# Patient Record
Sex: Female | Born: 1995 | Race: Black or African American | Hispanic: No | Marital: Single | Smoking: Former smoker
Health system: Southern US, Community
[De-identification: ages and names within clinical notes are randomized; demographics above are authoritative.]

## PROBLEM LIST (undated history)

## (undated) DIAGNOSIS — R102 Pelvic and perineal pain unspecified side: Secondary | ICD-10-CM

## (undated) DIAGNOSIS — R7303 Prediabetes: Secondary | ICD-10-CM

## (undated) DIAGNOSIS — G43909 Migraine, unspecified, not intractable, without status migrainosus: Secondary | ICD-10-CM

## (undated) DIAGNOSIS — L709 Acne, unspecified: Secondary | ICD-10-CM

## (undated) DIAGNOSIS — K219 Gastro-esophageal reflux disease without esophagitis: Secondary | ICD-10-CM

## (undated) DIAGNOSIS — N926 Irregular menstruation, unspecified: Secondary | ICD-10-CM

## (undated) DIAGNOSIS — J309 Allergic rhinitis, unspecified: Secondary | ICD-10-CM

## (undated) HISTORY — DX: Allergic rhinitis, unspecified: J30.9

## (undated) HISTORY — DX: Irregular menstruation, unspecified: N92.6

## (undated) HISTORY — DX: Migraine, unspecified, not intractable, without status migrainosus: G43.909

## (undated) HISTORY — DX: Morbid (severe) obesity due to excess calories: E66.01

## (undated) HISTORY — DX: Gastro-esophageal reflux disease without esophagitis: K21.9

## (undated) HISTORY — DX: Pelvic and perineal pain: R10.2

## (undated) HISTORY — DX: Pelvic and perineal pain unspecified side: R10.20

## (undated) HISTORY — DX: Acne, unspecified: L70.9

---
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
## 2017-03-03 ENCOUNTER — Encounter: Payer: Self-pay | Admitting: Emergency Medicine

## 2017-03-03 ENCOUNTER — Emergency Department: Payer: Medicaid Other

## 2017-03-03 ENCOUNTER — Emergency Department
Admission: EM | Admit: 2017-03-03 | Discharge: 2017-03-03 | Disposition: A | Discharge status: Home / Self Care | Payer: Medicaid Other | Attending: Student in an Organized Health Care Education/Training Program | Admitting: Student in an Organized Health Care Education/Training Program

## 2017-03-03 DIAGNOSIS — R1011 Right upper quadrant pain: Secondary | ICD-10-CM | POA: Insufficient documentation

## 2017-03-03 DIAGNOSIS — R252 Cramp and spasm: Secondary | ICD-10-CM | POA: Diagnosis present

## 2017-03-03 DIAGNOSIS — K649 Unspecified hemorrhoids: Secondary | ICD-10-CM | POA: Insufficient documentation

## 2017-03-03 DIAGNOSIS — K5900 Constipation, unspecified: Secondary | ICD-10-CM | POA: Insufficient documentation

## 2017-03-03 LAB — URINALYSIS, COMPLETE (UACMP) WITH MICROSCOPIC
BILIRUBIN URINE: NEGATIVE
GLUCOSE, UA: NEGATIVE mg/dL
Ketones, ur: 5 mg/dL — AB
LEUKOCYTES UA: NEGATIVE
NITRITE: NEGATIVE
PH: 5 (ref 5.0–8.0)
Protein, ur: 30 mg/dL — AB
SPECIFIC GRAVITY, URINE: 1.032 — AB (ref 1.005–1.030)

## 2017-03-03 LAB — CBC
HEMATOCRIT: 35.8 % (ref 35.0–47.0)
Hemoglobin: 11.5 g/dL — ABNORMAL LOW (ref 12.0–16.0)
MCH: 24.5 pg — ABNORMAL LOW (ref 26.0–34.0)
MCHC: 32 g/dL (ref 32.0–36.0)
MCV: 76.6 fL — ABNORMAL LOW (ref 80.0–100.0)
PLATELETS: 345 10*3/uL (ref 150–440)
RBC: 4.68 MIL/uL (ref 3.80–5.20)
RDW: 16.2 % — AB (ref 11.5–14.5)
WBC: 12.3 10*3/uL — AB (ref 3.6–11.0)

## 2017-03-03 LAB — COMPREHENSIVE METABOLIC PANEL
ALK PHOS: 61 U/L (ref 38–126)
ALT: 16 U/L (ref 14–54)
AST: 19 U/L (ref 15–41)
Albumin: 4 g/dL (ref 3.5–5.0)
Anion gap: 7 (ref 5–15)
BILIRUBIN TOTAL: 0.5 mg/dL (ref 0.3–1.2)
BUN: 10 mg/dL (ref 6–20)
CALCIUM: 9 mg/dL (ref 8.9–10.3)
CO2: 25 mmol/L (ref 22–32)
CREATININE: 0.76 mg/dL (ref 0.44–1.00)
Chloride: 108 mmol/L (ref 101–111)
GFR calc Af Amer: >60 mL/min (ref >60)
Glucose, Bld: 111 mg/dL — ABNORMAL HIGH (ref 65–99)
POTASSIUM: 3.6 mmol/L (ref 3.5–5.1)
Sodium: 140 mmol/L (ref 135–145)
TOTAL PROTEIN: 7.7 g/dL (ref 6.5–8.1)

## 2017-03-03 LAB — LIPASE, BLOOD: LIPASE: 26 U/L (ref 11–51)

## 2017-03-03 LAB — POCT PREGNANCY, URINE: Preg Test, Ur: NEGATIVE

## 2017-03-03 MED ORDER — HYDROCORTISONE 2.5 % RE CREA: TOPICAL_CREAM | RECTAL | 1 refills | Status: AC

## 2017-03-03 MED ORDER — POLYETHYLENE GLYCOL 3350 17 G PO PACK: 17.0000 g | PACK | Freq: Every day | ORAL | 0 refills | Status: DC

## 2017-03-03 NOTE — ED Triage Notes (Addendum)
Pt presents to ED via POV with c/o intermittent abdominal cramping, vaginal and rectal bleeding, and headache x2 weeks. Pt reports s/x's have come and gone over the last 14 days. Pt denies SHOB, CP, fevers or chills. Pt denies possibility of being pregnant, but last "normal" period was in February. Pt is A&O, in NAD; RR even regular, and unlabored.

## 2017-03-03 NOTE — ED Provider Notes (Signed)
[facility] Emergency Department Provider Note    First MD Initiated Contact with Patient 03/03/17 2117     (approximate)  I have reviewed the triage vital signs and the nursing notes.   HISTORY  Chief Complaint Abdominal Pain; Rectal Bleeding; and Vaginal Bleeding    HPI Natasha Lam is a 21 y.o. female presents with multiple complaints. Primarily seemed to be roughly 2 weeks of intermittent abdominal cramping and rectal bleeding. Patient is having some pain with defecation. States she is also having intermittent headaches. Has had headaches before. No fevers. States that the pain is primarily located in the right upper quadrant at this time. States she is also having some vaginal bleeding. She is not sexually active. Denies any chance of being pregnant. Does have a history of irregular menses. No lower abdominal pain or pelvic pain. No chest pain or shortness of breath.   No past medical history on file. No family history on file. No past surgical history on file. There are no active problems to display for this patient.     Prior to Admission medications   Medication Sig Start Date End Date Taking? Authorizing Provider  hydrocortisone (ANUSOL-HC) 2.5 % rectal cream Apply rectally 2 times daily 03/03/17 03/03/18  Willy Eddy, MD  polyethylene glycol [facility] / Ethelene Hal) packet Take 17 g by mouth daily. Mix one tablespoon with 8oz of your favorite juice or water every day until you are having soft formed stools. Then start taking once daily if you didn't have a stool the day before. 03/03/17   Willy Eddy, MD    Allergies Patient has no known allergies.    Social History Social History  Substance Use Topics  . Smoking status: Not on file  . Smokeless tobacco: Not on file  . Alcohol use Not on file    Review of Systems Patient denies headaches, rhinorrhea, blurry vision, numbness, shortness of breath, chest pain, edema, cough,  abdominal pain, nausea, vomiting, diarrhea, dysuria, fevers, rashes or hallucinations unless otherwise stated above in HPI. ____________________________________________   PHYSICAL EXAM:  VITAL SIGNS: Vitals:   03/03/17 1948 03/03/17 2209  BP: (!) 142/76 (!) 152/49  Pulse: (!) 104 91  Resp: 18 17  Temp: 99.2 F (37.3 C)   SpO2: 99% 98%    Constitutional: Alert and oriented. Well appearing and in no acute distress. Eyes: Conjunctivae are normal.  Head: Atraumatic. Nose: No congestion/rhinnorhea. Mouth/Throat: Mucous membranes are moist.   Neck: No stridor. Painless ROM.  Cardiovascular: Normal rate, regular rhythm. Grossly normal heart sounds.  Good peripheral circulation. Respiratory: Normal respiratory effort.  No retractions. Lungs CTAB. Gastrointestinal: Soft and nontender. No distention. No abdominal bruits. No CVA tenderness. Genitourinary: normal external genitalia (pelvic exam declined by patient) Musculoskeletal: No lower extremity tenderness nor edema.  No joint effusions. Neurologic:  Normal speech and language. No gross focal neurologic deficits are appreciated. No facial droop Skin:  Skin is warm, dry and intact. No rash noted. Psychiatric: Mood and affect are normal. Speech and behavior are normal.  ____________________________________________   LABS (all labs ordered are listed, but only abnormal results are displayed)  Results for orders placed or performed during the hospital encounter of 03/03/17 (from the past 24 hour(s))  Lipase, blood     Status: None   Collection Time: 03/03/17  7:56 PM  Result Value Ref Range   Lipase 26 11 - 51 U/L  Comprehensive metabolic panel     Status: Abnormal   Collection Time: 03/03/17  7:56 PM  Result Value Ref Range   Sodium 140 135 - 145 mmol/L   Potassium 3.6 3.5 - 5.1 mmol/L   Chloride 108 101 - 111 mmol/L   CO2 25 22 - 32 mmol/L   Glucose, Bld 111 (H) 65 - 99 mg/dL   BUN 10 6 - 20 mg/dL   Creatinine, Ser 1.610.76 0.44  - 1.00 mg/dL   Calcium 9.0 8.9 - 09.610.3 mg/dL   Total Protein 7.7 6.5 - 8.1 g/dL   Albumin 4.0 3.5 - 5.0 g/dL   AST 19 15 - 41 U/L   ALT 16 14 - 54 U/L   Alkaline Phosphatase 61 38 - 126 U/L   Total Bilirubin 0.5 0.3 - 1.2 mg/dL   GFR calc non Af Amer >60 >60 mL/min   GFR calc Af Amer >60 >60 mL/min   Anion gap 7 5 - 15  CBC     Status: Abnormal   Collection Time: 03/03/17  7:56 PM  Result Value Ref Range   WBC 12.3 (H) 3.6 - 11.0 K/uL   RBC 4.68 3.80 - 5.20 MIL/uL   Hemoglobin 11.5 (L) 12.0 - 16.0 g/dL   HCT 04.535.8 40.935.0 - 81.147.0 %   MCV 76.6 (L) 80.0 - 100.0 fL   MCH 24.5 (L) 26.0 - 34.0 pg   MCHC 32.0 32.0 - 36.0 g/dL   RDW 91.416.2 (H) 78.211.5 - 95.614.5 %   Platelets 345 150 - 440 K/uL  Urinalysis, Complete w Microscopic     Status: Abnormal   Collection Time: 03/03/17  7:56 PM  Result Value Ref Range   Color, Urine YELLOW (A) YELLOW   APPearance CLEAR (A) CLEAR   Specific Gravity, Urine 1.032 (H) 1.005 - 1.030   pH 5.0 5.0 - 8.0   Glucose, UA NEGATIVE NEGATIVE mg/dL   Hgb urine dipstick LARGE (A) NEGATIVE   Bilirubin Urine NEGATIVE NEGATIVE   Ketones, ur 5 (A) NEGATIVE mg/dL   Protein, ur 30 (A) NEGATIVE mg/dL   Nitrite NEGATIVE NEGATIVE   Leukocytes, UA NEGATIVE NEGATIVE   RBC / HPF 0-5 0 - 5 RBC/hpf   WBC, UA 0-5 0 - 5 WBC/hpf   Bacteria, UA RARE (A) NONE SEEN   Squamous Epithelial / LPF 0-5 (A) NONE SEEN   Mucus PRESENT   Pregnancy, urine POC     Status: None   Collection Time: 03/03/17  8:18 PM  Result Value Ref Range   Preg Test, Ur NEGATIVE NEGATIVE   ____________________________________________ ____________________________________________  RADIOLOGY  I personally reviewed all radiographic images ordered to evaluate for the above acute complaints and reviewed radiology reports and findings.  These findings were personally discussed with the patient.  Please see medical record for radiology  report.  ____________________________________________   PROCEDURES  Procedure(s) performed:  Procedures    Critical Care performed: no ____________________________________________   INITIAL IMPRESSION / ASSESSMENT AND PLAN / ED COURSE  Pertinent labs & imaging results that were available during my care of the patient were reviewed by me and considered in my medical decision making (see chart for details).  DDX: constipation, choleithiasis, pancreatitis, enteritis, unlikely appy, torsion, toa, PID  Natasha Lam is a 21 y.o. who presents to the ED with the above symptoms. Patient otherwise well appearing and in no acute distress. Able to ambulate into the ER with noted discomfort in her unsteady gait. Patient is not pregnant. Blood work sent for the above differential shows mildly exudative ptosis but is otherwise reassuring. Patient has no  right lower abdominal pain or left lower quadrant abdominal pain to suggest acute pelvic pathology or appendicitis. ultrasound ordered for mild right upper quadrant discomfort. ultrasound shows no evidence of acute abnormality. Rectal exam does show a small nonthrombosed hemorrhoid and KUB does show moderate stool. Based on her abdominal exam I do not feel that further diagnostic testing is clinically indicated due to concern for radiation exposure in an otherwise well-appearing 21 year old female. Patient is tolerating oral hydration. Do feel that she is appropriate for close follow-up with her PCP.      ____________________________________________   FINAL CLINICAL IMPRESSION(S) / ED DIAGNOSES  Final diagnoses:  RUQ pain  Hemorrhoids, unspecified hemorrhoid type  Constipation, unspecified constipation type      NEW MEDICATIONS STARTED DURING THIS VISIT:  Discharge Medication List as of 03/03/2017 10:24 PM    START taking these medications   Details  hydrocortisone (ANUSOL-HC) 2.5 % rectal cream Apply rectally 2 times daily, Print     polyethylene glycol (MIRALAX / GLYCOLAX) packet Take 17 g by mouth daily. Mix one tablespoon with 8oz of your favorite juice or water every day until you are having soft formed stools. Then start taking once daily if you didn't have a stool the day before., Starting Wed 03/03/2017, Print         Note:  This document was prepared using Dragon voice recognition software and may include unintentional dictation errors.    Willy Eddy, MD 03/03/17 [phone]

## 2017-03-03 NOTE — ED Notes (Signed)

## 2017-03-03 NOTE — Discharge Instructions (Signed)

## 2017-03-23 LAB — VITAMIN D 25 HYDROXY (VIT D DEFICIENCY, FRACTURES): VIT D 25 HYDROXY: 13.5

## 2017-03-23 LAB — LIPID PANEL
CHOLESTEROL: 154 (ref 0–200)
HDL: 39 (ref 35–70)
LDL CALC: 105

## 2017-03-23 LAB — CBC AND DIFFERENTIAL
HEMATOCRIT: 35 — AB (ref 36–46)
Hemoglobin: 10.8 — AB (ref 12.0–16.0)

## 2017-04-14 ENCOUNTER — Ambulatory Visit (INDEPENDENT_AMBULATORY_CARE_PROVIDER_SITE_OTHER): Payer: Medicaid Other | Admitting: Obstetrics and Gynecology

## 2017-04-14 ENCOUNTER — Encounter: Payer: Self-pay | Admitting: Obstetrics and Gynecology

## 2017-04-14 VITALS — BP 125/80 | HR 80 | Ht 64.0 in | Wt 314.6 lb

## 2017-04-14 DIAGNOSIS — N938 Other specified abnormal uterine and vaginal bleeding: Secondary | ICD-10-CM

## 2017-04-14 NOTE — Progress Notes (Addendum)
HPI:      Ms. Natasha Lam is a 21 y.o. G0P0000 who LMP was Patient's last menstrual period was 04/12/2017.  Subjective:   She presents today To discuss her irregular vaginal bleeding. Over the last 5 months she has had irregular bleeding. Prior to that she skipped several months without a menses. Menarche at age 62. She was begun on OCPs one week ago by her family doctor and her bleeding has become spotting only since that time. She states that she has also had some rectal bleeding when she wipes after bowel movements. She reports heart stools with occasional constipation and straining to have bowel movements. She reports that she has never been sexually active.    Hx: The following portions of the patient's history were reviewed and updated as appropriate:             She  has a past medical history of Acne; Allergic rhinitis; GERD (gastroesophageal reflux disease); Irregular menses; Migraines; Morbid obesity (HCC); and Pelvic pain. She  does not have a problem list on file. She  has a past surgical history that includes Tonsillectomy and adenoidectomy (2010). Her family history includes Asthma in her brother, maternal grandmother, and mother; Cancer in her maternal aunt; Diabetes in her maternal aunt, maternal grandfather, and maternal grandmother; Heart failure in her maternal grandfather; Hyperlipidemia in her maternal grandmother; Hypertension in her maternal aunt, maternal grandfather, maternal grandmother, and mother; Migraines in her mother; Stroke in her maternal grandfather and maternal grandmother. She  reports that she has been smoking Cigarettes.  She has never used smokeless tobacco. She reports that she does not drink alcohol or use drugs. She has No Known Allergies.       Review of Systems:  Review of Systems  Constitutional: Denied constitutional symptoms, night sweats, recent illness, fatigue, fever, insomnia and weight loss.  Eyes: Denied eye symptoms, eye pain,  photophobia, vision change and visual disturbance.  Ears/Nose/Throat/Neck: Denied ear, nose, throat or neck symptoms, hearing loss, nasal discharge, sinus congestion and sore throat.  Cardiovascular: Denied cardiovascular symptoms, arrhythmia, chest pain/pressure, edema, exercise intolerance, orthopnea and palpitations.  Respiratory: Denied pulmonary symptoms, asthma, pleuritic pain, productive sputum, cough, dyspnea and wheezing.  Gastrointestinal: Denied, gastro-esophageal reflux, melena, nausea and vomiting.  Genitourinary: See HPI for additional information.  Musculoskeletal: Denied musculoskeletal symptoms, stiffness, swelling, muscle weakness and myalgia.  Dermatologic: Denied dermatology symptoms, rash and scar.  Neurologic: Denied neurology symptoms, dizziness, headache, neck pain and syncope.  Psychiatric: Denied psychiatric symptoms, anxiety and depression.  Endocrine: Denied endocrine symptoms including hot flashes and night sweats.   Meds:   Current Outpatient Prescriptions on File Prior to Visit  Medication Sig Dispense Refill  . hydrocortisone (ANUSOL-HC) 2.5 % rectal cream Apply rectally 2 times daily 30 g 1  . polyethylene glycol (MIRALAX / GLYCOLAX) packet Take 17 g by mouth daily. Mix one tablespoon with 8oz of your favorite juice or water every day until you are having soft formed stools. Then start taking once daily if you didn't have a stool the day before. 30 each 0   No current facility-administered medications on file prior to visit.     Objective:     Vitals:   04/14/17 1538  BP: 125/80  Pulse: 80              Patient declined examination today.  Assessment:    G0P0000 There are no active problems to display for this patient.    1. DUB (dysfunctional uterine bleeding)  2. Morbid obesity (HCC)     Recently begun on OCPs by family physician.  Recent ultrasound normal.     Plan:            1.  Continue OCPs. The risks of OCPs and obesity  discussed in detail. Offered IUD use but patient's lack of sexual activity or tampons may make this option difficult. Patient declined and will stay on OCPs.  2. Discussed use of fiber laxatives daily to make stools soft and I expect this will resolve her occasional problem with rectal bleeding. I suspicion of courses hemorrhoids. If her rectal bleeding does not resolve with keeping her stools soft she will certainly need further workup.  3.  Unable to obtain baseline PCO labs because of the OCPs. Will check a fasting morning prolactin, insulin and glucose, and TSH.  4.  Used in the subcutaneous OCPs in regulating her cycle and prevention of endometrial cancer as well as maintenance of ovarian hormone levels discussed.  Orders No orders of the defined types were placed in this encounter.   No orders of the defined types were placed in this encounter.     F/U  Return in about 3 months (around 07/15/2017) for We will contact her with any abnormal test results. I spent 30 minutes with this patient of which greater than 50% was spent discussing the above issues with her dysfunctional bleeding, probable anovulation, use of birth control pills or IUD, future risk of endometrial hyperplasia, possibility of PCO, hormonal abnormalities, obesity, rectal bleeding and treatment as well as future workup if necessary.  Elonda Huskyavid J. Evans, M.D. 04/14/2017 4:26 PM

## 2017-04-21 ENCOUNTER — Other Ambulatory Visit: Payer: Medicaid Other

## 2017-05-07 ENCOUNTER — Encounter: Payer: Self-pay | Admitting: Obstetrics and Gynecology

## 2017-07-15 ENCOUNTER — Encounter: Payer: Medicaid Other | Admitting: Obstetrics and Gynecology

## 2018-02-22 ENCOUNTER — Encounter: Payer: Self-pay | Admitting: *Deleted

## 2018-02-22 ENCOUNTER — Other Ambulatory Visit: Payer: Self-pay

## 2018-02-22 DIAGNOSIS — Z79899 Other long term (current) drug therapy: Secondary | ICD-10-CM | POA: Insufficient documentation

## 2018-02-22 DIAGNOSIS — F17210 Nicotine dependence, cigarettes, uncomplicated: Secondary | ICD-10-CM | POA: Insufficient documentation

## 2018-02-22 DIAGNOSIS — R51 Headache: Secondary | ICD-10-CM | POA: Insufficient documentation

## 2018-02-22 NOTE — ED Triage Notes (Addendum)
Pt ambulatory to triage.  Pt has a headache and bodyaches since 2200 tonight.  Pt reports pain in both shoulders, chest, arms and legs.   Pt alert   Speech clear.

## 2018-02-23 ENCOUNTER — Emergency Department: Payer: Self-pay

## 2018-02-23 ENCOUNTER — Emergency Department
Admission: EM | Admit: 2018-02-23 | Discharge: 2018-02-23 | Disposition: A | Discharge status: Home / Self Care | Payer: Self-pay | Attending: Emergency Medicine | Admitting: Emergency Medicine

## 2018-02-23 DIAGNOSIS — R51 Headache: Secondary | ICD-10-CM

## 2018-02-23 DIAGNOSIS — R519 Headache, unspecified: Secondary | ICD-10-CM

## 2018-02-23 LAB — BASIC METABOLIC PANEL
Anion gap: 7 (ref 5–15)
BUN: 12 mg/dL (ref 6–20)
CO2: 25 mmol/L (ref 22–32)
CREATININE: 0.68 mg/dL (ref 0.44–1.00)
Calcium: 8.9 mg/dL (ref 8.9–10.3)
Chloride: 110 mmol/L (ref 98–111)
GFR calc Af Amer: >60 mL/min (ref >60)
Glucose, Bld: 114 mg/dL — ABNORMAL HIGH (ref 70–99)
Potassium: 3.5 mmol/L (ref 3.5–5.1)
SODIUM: 142 mmol/L (ref 135–145)

## 2018-02-23 LAB — URINALYSIS, COMPLETE (UACMP) WITH MICROSCOPIC
Bacteria, UA: NONE SEEN
Bilirubin Urine: NEGATIVE
GLUCOSE, UA: NEGATIVE mg/dL
HGB URINE DIPSTICK: NEGATIVE
Ketones, ur: 5 mg/dL — AB
Leukocytes, UA: NEGATIVE
NITRITE: NEGATIVE
Protein, ur: NEGATIVE mg/dL
SPECIFIC GRAVITY, URINE: 1.033 — AB (ref 1.005–1.030)
pH: 5 (ref 5.0–8.0)

## 2018-02-23 LAB — TROPONIN I

## 2018-02-23 LAB — CBC
HCT: 33.7 % — ABNORMAL LOW (ref 35.0–47.0)
Hemoglobin: 11 g/dL — ABNORMAL LOW (ref 12.0–16.0)
MCH: 23.8 pg — ABNORMAL LOW (ref 26.0–34.0)
MCHC: 32.5 g/dL (ref 32.0–36.0)
MCV: 73.3 fL — ABNORMAL LOW (ref 80.0–100.0)
PLATELETS: 359 10*3/uL (ref 150–440)
RBC: 4.6 MIL/uL (ref 3.80–5.20)
RDW: 17.6 % — AB (ref 11.5–14.5)
WBC: 10.7 10*3/uL (ref 3.6–11.0)

## 2018-02-23 LAB — MONONUCLEOSIS SCREEN: Mono Screen: NEGATIVE

## 2018-02-23 LAB — POCT PREGNANCY, URINE: PREG TEST UR: NEGATIVE

## 2018-02-23 MED ORDER — BUTALBITAL-APAP-CAFFEINE 50-325-40 MG PO TABS: 1.0000 | ORAL_TABLET | Freq: Four times a day (QID) | ORAL | 0 refills | Status: DC | PRN

## 2018-02-23 MED ORDER — BUTALBITAL-APAP-CAFFEINE 50-325-40 MG PO TABS
1.0000 | ORAL_TABLET | Freq: Once | ORAL | Status: AC
  Administered 2018-02-23: 1 via ORAL
  Filled 2018-02-23: qty 1

## 2018-03-01 NOTE — ED Provider Notes (Signed)
[facility] Emergency Department Provider Note    First MD Initiated Contact with Patient 02/23/18 0138     (approximate)  I have reviewed the triage vital signs and the nursing notes.   HISTORY  Chief Complaint Generalized Body Aches and Migraine    HPI Natasha Lam is a 22 y.o. female below list of chronic medical conditions presents to the emergency department with headache and generalized body aches (shoulders chest arms and legs) since 10 PM last night patient denies any fever afebrile on presentation.  Patient denies any neck pain or stiffness.  She denies any weakness numbness gait instability or visual changes.   Past Medical History:  Diagnosis Date  . Acne   . Allergic rhinitis   . GERD (gastroesophageal reflux disease)   . Irregular menses   . Migraines   . Morbid obesity (HCC)   . Pelvic pain    moderate    There are no active problems to display for this patient.   Past Surgical History:  Procedure Laterality Date  . TONSILLECTOMY AND ADENOIDECTOMY  2010    Prior to Admission medications   Medication Sig Start Date End Date Taking? Authorizing Provider  butalbital-acetaminophen-caffeine (FIORICET, ESGIC) 50-325-40 MG tablet Take 1 tablet by mouth every 6 (six) hours as needed for up to 20 doses for headache. 02/23/18   Darci Current, MD  Butalbital-APAP-Caffeine 50-300-40 MG CAPS TAKE 1 CAPSULE BY MOUTH THREE TIMES A DAY AS NEEDED 03/23/17   [provider]  hydrocortisone (ANUSOL-HC) 2.5 % rectal cream Apply rectally 2 times daily 03/03/17 03/03/18  Willy Eddy, MD  LINZESS 145 MCG CAPS capsule Take 145 mcg by mouth daily. 03/24/17   [provider]  pantoprazole (PROTONIX) 40 MG tablet Take 40 mg by mouth daily. 03/23/17   [provider]  polyethylene glycol (MIRALAX / GLYCOLAX) packet Take 17 g by mouth daily. Mix one tablespoon with 8oz of your favorite juice or water every day until you are  having soft formed stools. Then start taking once daily if you didn't have a stool the day before. 03/03/17   Willy Eddy, MD  TRI-SPRINTEC 0.18/0.215/0.25 MG-35 MCG tablet Take 1 tablet by mouth daily. 03/24/17   [provider]    Allergies Patient has no known allergies.  Family History  Problem Relation Age of Onset  . Asthma Mother   . Hypertension Mother   . Migraines Mother   . Asthma Brother   . Asthma Maternal Grandmother   . Diabetes Maternal Grandmother   . Hyperlipidemia Maternal Grandmother   . Hypertension Maternal Grandmother   . Stroke Maternal Grandmother   . Diabetes Maternal Grandfather   . Heart failure Maternal Grandfather   . Hypertension Maternal Grandfather   . Stroke Maternal Grandfather   . Cancer Maternal Aunt        throat  . Diabetes Maternal Aunt   . Hypertension Maternal Aunt     Social History Social History   Tobacco Use  . Smoking status: Current Every Day Smoker    Types: Cigarettes  . Smokeless tobacco: Never Used  Substance Use Topics  . Alcohol use: No  . Drug use: No    Review of Systems Constitutional: No fever/chills Eyes: No visual changes. ENT: No sore throat. Cardiovascular: Denies chest pain. Respiratory: Denies shortness of breath. Gastrointestinal: No abdominal pain.  No nausea, no vomiting.  No diarrhea.  No constipation. Genitourinary: Negative for dysuria. Musculoskeletal: Generalized muscle pain both shoulders chest  arms and legs Integumentary: Negative for rash. Neurological: Negative for headaches, focal weakness or numbness.  ____________________________________________   PHYSICAL EXAM:  VITAL SIGNS: ED Triage Vitals  Enc Vitals Group     BP 02/22/18 2341 130/75     Pulse Rate 02/22/18 2341 (!) 102     Resp 02/22/18 2341 20     Temp 02/22/18 2341 99.1 F (37.3 C)     Temp Source 02/22/18 2341 Oral     SpO2 02/22/18 2341 99 %     Weight 02/22/18 2342 136.1 kg (300 lb)     Height  02/22/18 2342 1.626 m (5\' 4" )     Head Circumference --      Peak Flow --      Pain Score 02/22/18 2342 7     Pain Loc --      Pain Edu? --      Excl. in GC? --     Constitutional: Alert and oriented. Well appearing and in no acute distress. Eyes: Conjunctivae are normal.  Head: Atraumatic. Mouth/Throat: Mucous membranes are moist. Oropharynx non-erythematous. Neck: No stridor.  No meningeal signs.   Cardiovascular: Normal rate, regular rhythm. Good peripheral circulation. Grossly normal heart sounds. Respiratory: Normal respiratory effort.  No retractions. Lungs CTAB. Gastrointestinal: Soft and nontender. No distention.  Musculoskeletal: No lower extremity tenderness nor edema. No gross deformities of extremities. Neurologic:  Normal speech and language. No gross focal neurologic deficits are appreciated.  Skin:  Skin is warm, dry and intact. No rash noted. Psychiatric: Mood and affect are normal. Speech and behavior are normal.   LABS (all labs ordered are listed, but only abnormal results are displayed)  Labs Reviewed  BASIC METABOLIC PANEL - Abnormal; Notable for the following components:      Result Value   Glucose, Bld 114 (*)    All other components within normal limits  CBC - Abnormal; Notable for the following components:   Hemoglobin 11.0 (*)    HCT 33.7 (*)    MCV 73.3 (*)    MCH 23.8 (*)    RDW 17.6 (*)    All other components within normal limits  URINALYSIS, COMPLETE (UACMP) WITH MICROSCOPIC - Abnormal; Notable for the following components:   Color, Urine YELLOW (*)    APPearance HAZY (*)    Specific Gravity, Urine 1.033 (*)    Ketones, ur 5 (*)    All other components within normal limits  TROPONIN I  MONONUCLEOSIS SCREEN  POC URINE PREG, ED  POCT PREGNANCY, URINE     RADIOLOGY I, Smolan N Braelyn Jenson, personally viewed and evaluated these images (plain radiographs) as part of my medical decision making, as well as reviewing the written report by the  radiologist.  ED MD interpretation: Normal noncontrast CT scan of the head    Procedures   ____________________________________________   INITIAL IMPRESSION / ASSESSMENT AND PLAN / ED COURSE  As part of my medical decision making, I reviewed the following data within the electronic MEDICAL RECORD NUMBER   22 year old female presented with above-stated history and physical exam secondary to headache and generalized body aches.  Patient given Fioricet with resolution of headache.  Consider possibility of intracranial pathology and such CT scan of the head was performed which revealed no acute pathology.  Also consider possibly of mononucleosis however mono was negative.  Patient afebrile remained that way during ED stay vital signs stable.  Patient's mother at bedside states that the family history of migraines and that her migraine  headache started at a similar age of the patient.  And as such suspect this to be the potential etiology.  ____________________________________________  FINAL CLINICAL IMPRESSION(S) / ED DIAGNOSES  Final diagnoses:  Acute nonintractable headache, unspecified headache type     MEDICATIONS GIVEN DURING THIS VISIT:  Medications  butalbital-acetaminophen-caffeine (FIORICET, ESGIC) 50-325-40 MG per tablet 1 tablet (1 tablet Oral Given 02/23/18 0142)     ED Discharge Orders         Ordered    butalbital-acetaminophen-caffeine (FIORICET, ESGIC) 50-325-40 MG tablet  Every 6 hours PRN     02/23/18 6761           Note:  This document was prepared using Dragon voice recognition software and may include unintentional dictation errors.    Darci Current, MD 03/01/18 [phone]

## 2018-10-04 IMAGING — CR DG ABDOMEN 1V
1 series · 2 of 2 positions shown · non-contrast
Comparison: None.

CLINICAL DATA: Right upper quadrant pain.

EXAM:
ABDOMEN - 1 VIEW

[Series 1: dg abd 1 view · 0.14mm/px · 2 of 2 slices shown]
[im 1/2]
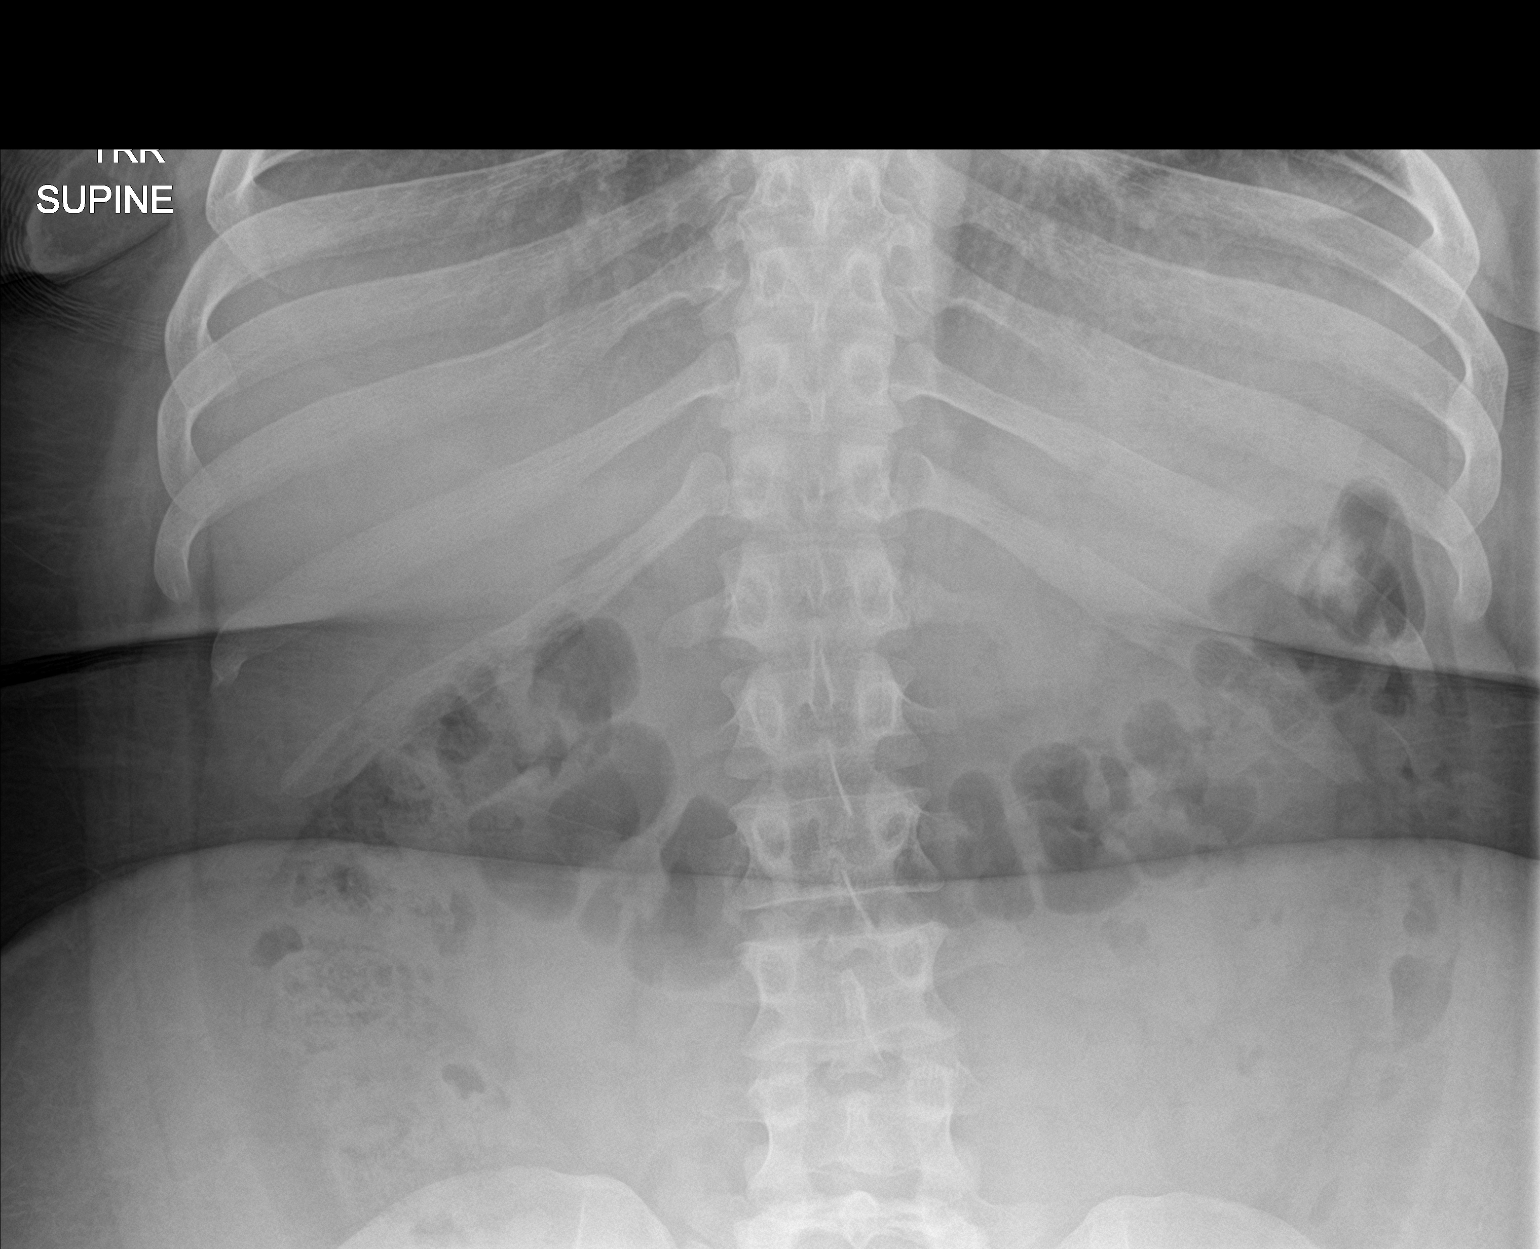
[im 2/2]
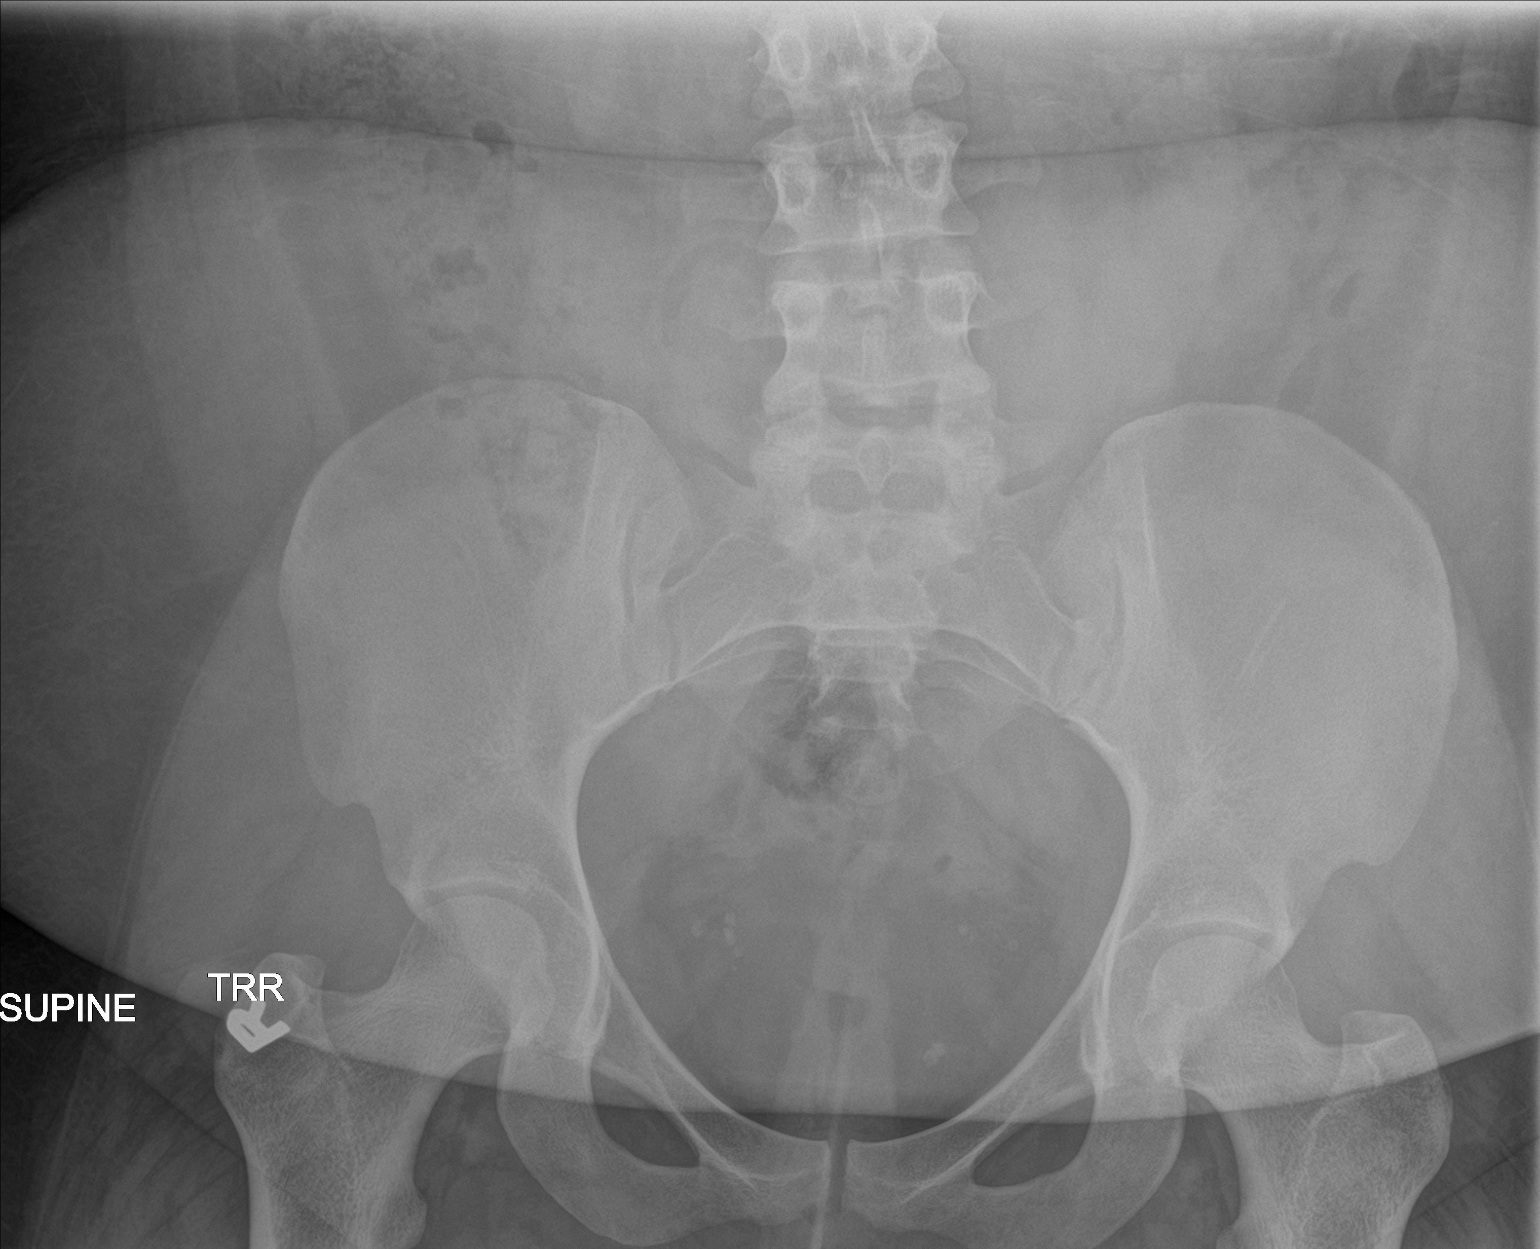

[2 of 2 positions shown; findings below may reference images not displayed]

FINDINGS: Normal bowel gas pattern. No bowel dilatation to suggest
obstruction. Moderate stool in the ascending colon, small volume of
stool distally. No evidence of free air on supine views. Pelvic
calcifications are likely phleboliths. No radiopaque calculi project
over the renal beds are right upper quadrant. No acute osseous
abnormalities. Soft tissue attenuation from body habitus limits
detailed assessment.
IMPRESSION: Unremarkable abdominal radiograph. Pelvic calcifications are likely
phleboliths.

## 2019-04-27 ENCOUNTER — Other Ambulatory Visit: Payer: Self-pay

## 2019-04-27 ENCOUNTER — Ambulatory Visit (LOCAL_COMMUNITY_HEALTH_CENTER): Payer: Self-pay | Admitting: Family Medicine

## 2019-04-27 VITALS — BP 135/88 | Ht 66.0 in | Wt 318.0 lb

## 2019-04-27 DIAGNOSIS — Z124 Encounter for screening for malignant neoplasm of cervix: Secondary | ICD-10-CM

## 2019-04-27 DIAGNOSIS — Z113 Encounter for screening for infections with a predominantly sexual mode of transmission: Secondary | ICD-10-CM

## 2019-04-27 DIAGNOSIS — N926 Irregular menstruation, unspecified: Secondary | ICD-10-CM

## 2019-04-27 DIAGNOSIS — G43709 Chronic migraine without aura, not intractable, without status migrainosus: Secondary | ICD-10-CM

## 2019-04-27 DIAGNOSIS — G43909 Migraine, unspecified, not intractable, without status migrainosus: Secondary | ICD-10-CM | POA: Insufficient documentation

## 2019-04-27 DIAGNOSIS — Z30011 Encounter for initial prescription of contraceptive pills: Secondary | ICD-10-CM

## 2019-04-27 LAB — WET PREP FOR TRICH, YEAST, CLUE
Trichomonas Exam: NEGATIVE
Yeast Exam: NEGATIVE

## 2019-04-27 LAB — PREGNANCY, URINE: Preg Test, Ur: NEGATIVE

## 2019-04-27 MED ORDER — NORGESTIM-ETH ESTRAD TRIPHASIC 0.18/0.215/0.25 MG-35 MCG PO TABS: 1.0000 | ORAL_TABLET | Freq: Every day | ORAL | 4 refills | Status: DC

## 2019-04-27 MED ORDER — NORGESTIM-ETH ESTRAD TRIPHASIC 0.18/0.215/0.25 MG-25 MCG PO TABS: 1.0000 | ORAL_TABLET | Freq: Every day | ORAL | 11 refills | Status: DC

## 2019-04-27 NOTE — Progress Notes (Addendum)
Here today for concerns of "PID or Ectopic Pregnancy." Patient states she has had only vaginal spotting since 03/20/2019 and "thinks she could be pregnant." Last Physical per Epic was 04/14/2017 with Dr. Amalia Hailey ([facility] Hal Morales, RN

## 2019-04-27 NOTE — Progress Notes (Signed)
Wet mount reviewed; no tx per SO. Dispensed #3 packs of from [place]; patient states doesn't have Medicaid. Aileen Fass, RN

## 2019-04-27 NOTE — Progress Notes (Signed)
Family Planning Visit- Repeat Yearly Visit  Subjective:  Natasha Lam is a 23 y.o. being seen today for an well woman visit and to discuss family planning options.    She is currently using none for pregnancy prevention. Patient reports she does not want a pregnancy in the next year. Patient  has Migraines and Morbidly obese ([place]) on their problem list.  Chief Complaint  Patient presents with  . Gynecologic Exam    Ectopic Pregnancy?    Vaginal Bleeding:  Started spotting 9/21, intermittent and then heavier bleeding starting on 10/16. Has had intermittent unprotected sex. 10/16 last sex intercourse- and was without condoms.  Reports clots starting the beginning of this week. Has not been on OCP for > 1 month  Patient denies N/V, reports LLQ pain- sometimes better with BM.   Does the patient desire a pregnancy in the next year? (OKQ flowsheet)  See flowsheet for other program required questions.   Body mass index is 51.33 kg/m. - Patient is eligible for diabetes screening based on BMI and age >43?  no HA1C ordered? no  Desires STI screening?  Yes    Health Maintenance Due  Topic Date Due  . HIV Screening  07/12/2011  . TETANUS/TDAP  07/12/2015  . PAP-Cervical Cytology Screening  07/11/2017  . PAP SMEAR-Modifier  07/11/2017  . INFLUENZA VACCINE  01/28/2019    Review of Systems  Constitutional: Negative for chills and fever.  Eyes: Negative for blurred vision and double vision.  Respiratory: Negative for cough and shortness of breath.   Cardiovascular: Negative for chest pain and orthopnea.  Gastrointestinal: Negative for nausea and vomiting.  Genitourinary: Negative for dysuria, flank pain and frequency.  Musculoskeletal: Negative for myalgias.  Skin: Negative for rash.  Neurological: Negative for dizziness, tingling, weakness and headaches.  Endo/Heme/Allergies: Does not bruise/bleed easily.  Psychiatric/Behavioral: Negative for depression and suicidal ideas.  The patient is not nervous/anxious.     The following portions of the patient's history were reviewed and updated as appropriate: allergies, current medications, past family history, past medical history, past social history, past surgical history and problem list. Problem list updated.  Objective:   Vitals:   04/27/19 0923  BP: 135/88  Weight: (!) 318 lb (144.2 kg)  Height: 5\' 6"  (1.676 m)    Physical Exam Vitals signs and nursing note reviewed.  Constitutional:      Appearance: Normal appearance.  HENT:     Head: Normocephalic.  Pulmonary:     Effort: Pulmonary effort is normal.  Genitourinary:    Vagina: Normal.     Cervix: Cervical bleeding present. No cervical motion tenderness or friability.     Uterus: Normal.      Adnexa: Right adnexa normal and left adnexa normal.  Musculoskeletal: Normal range of motion.  Skin:    General: Skin is warm and dry.  Neurological:     Mental Status: She is alert. Mental status is at baseline.       Assessment and Plan:  Nyelle Wolfson is a 23 y.o. female presenting to the [facility] Department for an initial well woman exam/family planning visit  Contraception counseling: Reviewed all forms of birth control options available including abstinence; over the counter/barrier methods; hormonal contraceptive medication including pill, patch, ring, injection,contraceptive implant; hormonal and nonhormonal IUDs; permanent sterilization options including vasectomy and the various tubal sterilization modalities. Risks and benefits reviewed.  Questions were answered.  Written information was also given to the patient to review.  Patient desires OCP,  this was prescribed for patient. She will follow up in  3 months for surveillance.  She was told to call with any further questions, or with any concerns about this method of contraception.  Emphasized use of condoms 100% of the time for STI prevention.  1. Irregular periods Likely  anovulatory bleeding, needs work up with PCP- TSH, HA1C. Counseled on IUD for bleeding management and OCP for cycle control. Recommended home urine pregnancy test in 3-4d given unprotected sex timing.  - Pregnancy, urine - IGP, rfx Aptima HPV ASCU - Norgestimate-Ethinyl Estradiol Triphasic (TRI-SPRINTEC) 0.18/0.215/0.25 MG-35 MCG tablet; Take 1 tablet by mouth daily.  Dispense: 3 Package; Refill: 4  2. Screening examination for venereal disease Treat wet mount per standing - Chlamydia/Gonorrhea [place] [facility] - WET PREP FOR TRICH, YEAST, CLUE - HIV [place] [facility] - Syphilis Serology, [place] [facility]  3. Screening for cervical cancer - IGP, rfx Aptima HPV ASCU  4. Chronic migraine without aura without status migrainosus, not intractable Recommend progesterone only methods  5. Morbidly obese (H[facility])   6. Encounter for initial prescription of contraceptive pills - Dispense 3 packs of pills patient to call if she wants to continue and can come pick up 1 year supply at that time. She is considering IUD vs Depo - Norgestimate-Ethinyl Estradiol Triphasic (TRI-SPRINTEC) 0.18/0.215/0.25 MG-35 MCG tablet; Take 1 tablet by mouth daily.  Dispense: 3 Package; Refill: 4   Return in about 3 months (around 07/28/2019) for f/u BCM, continue OCP vs method change.  No future appointments.  Federico Flake, MD

## 2019-05-01 LAB — IGP, RFX APTIMA HPV ASCU: PAP Smear Comment: 0

## 2019-05-04 ENCOUNTER — Ambulatory Visit: Payer: Self-pay

## 2019-05-04 ENCOUNTER — Telehealth: Payer: Self-pay

## 2019-05-04 ENCOUNTER — Other Ambulatory Visit: Payer: Self-pay

## 2019-05-04 DIAGNOSIS — A749 Chlamydial infection, unspecified: Secondary | ICD-10-CM

## 2019-05-04 MED ORDER — AZITHROMYCIN 500 MG PO TABS
1000.0000 mg | ORAL_TABLET | Freq: Once | ORAL | Status: AC
  Administered 2019-05-04: 1000 mg via ORAL

## 2019-05-04 NOTE — Telephone Encounter (Signed)
TC to patient. Verified ID via password/SS#. Informed of positive chlamydia and need for tx. Instructed to eat before visit and have partner call for tx appt. Appt scheduled.Azarria Balint, RN    

## 2019-05-04 NOTE — Progress Notes (Signed)
Tx'd for Chlamydia per SO Aileen Fass, RN

## 2019-06-07 NOTE — Progress Notes (Signed)
Patient has Medicaid- verified by [employer] Natasha Lam

## 2019-06-12 ENCOUNTER — Ambulatory Visit: Payer: Medicaid Other | Admitting: Family Medicine

## 2019-06-12 ENCOUNTER — Encounter: Payer: Self-pay | Admitting: Family Medicine

## 2019-06-12 ENCOUNTER — Other Ambulatory Visit: Payer: Self-pay

## 2019-06-12 DIAGNOSIS — Z720 Tobacco use: Secondary | ICD-10-CM

## 2019-06-12 DIAGNOSIS — Z113 Encounter for screening for infections with a predominantly sexual mode of transmission: Secondary | ICD-10-CM

## 2019-06-12 DIAGNOSIS — F419 Anxiety disorder, unspecified: Secondary | ICD-10-CM

## 2019-06-12 DIAGNOSIS — N939 Abnormal uterine and vaginal bleeding, unspecified: Secondary | ICD-10-CM

## 2019-06-12 DIAGNOSIS — F329 Major depressive disorder, single episode, unspecified: Secondary | ICD-10-CM

## 2019-06-12 LAB — PREGNANCY, URINE: Preg Test, Ur: NEGATIVE

## 2019-06-12 LAB — WET PREP FOR TRICH, YEAST, CLUE
Trichomonas Exam: NEGATIVE
Yeast Exam: NEGATIVE

## 2019-06-12 LAB — HEMOGLOBIN, FINGERSTICK: Hemoglobin: 10.7 g/dL — ABNORMAL LOW (ref 11.1–15.9)

## 2019-06-12 NOTE — Progress Notes (Signed)
Wet mount reviewed, no tx per standing order. Hgb of 10.7 reviewed by provider; per verbal by Charlotte Sanes, PA pt should start OTC iron supplement, 150 mg Ferrous Sulfate BID or 300 mg qd. Pt confirmed best number for [facility] to call to schedule appt in regard to referral would be [phone].

## 2019-06-12 NOTE — Progress Notes (Signed)
[facility] Department STI clinic/screening visit  Subjective:  Natasha Lam is a 24 y.o. female being seen today for  Chief Complaint  Patient presents with  . SEXUALLY TRANSMITTED DISEASE  The patient reports they do have symptoms.  Patient has the following medical conditions:   Patient Active Problem List   Diagnosis Date Noted  . Migraines 04/27/2019  . Morbidly obese ([place]) 04/27/2019      Pt here for continued mid-abdominal pain and vaginal bleeding x3 months. Seen here in Oct, started on OCP Nov 12 which stopped bleeding for 4 days but then it began again. States she will go through 4 pads per day, sometimes with clots of blood. Denies dizziness or fainting.   Has also been having sticky discharge x1 month, smells bad.   She was treated for +chlamydia result on 11/5, states she has not had sex since treatment.    See flowsheet for further details and programmatic requirements.   No components found for: HCV]  The following portions of the patient's history were reviewed and updated as appropriate: allergies, current medications, past medical history, past social history, past surgical history and problem list.  Objective:  There were no vitals filed for this visit.   Physical Exam Vitals and nursing note reviewed.  Constitutional:      Appearance: Normal appearance.  HENT:     Head: Normocephalic and atraumatic.     Mouth/Throat:     Mouth: Mucous membranes are moist.     Pharynx: Oropharynx is clear. No oropharyngeal exudate or posterior oropharyngeal erythema.  Pulmonary:     Effort: Pulmonary effort is normal.  Abdominal:     General: Abdomen is flat.     Palpations: There is no mass.     Tenderness: There is generalized abdominal tenderness and tenderness in the suprapubic area. There is no rebound.  Genitourinary:    General: Normal vulva.     Exam position: Lithotomy position.     Pubic Area: No rash or pubic lice.      Labia:         Right: No rash or lesion.        Left: No rash or lesion.      Vagina: No vaginal discharge, erythema, bleeding or lesions.     Cervix: Cervical bleeding present. No cervical motion tenderness, discharge, friability, lesion or erythema.     Uterus: Normal.      Adnexa: Right adnexa normal and left adnexa normal.     Rectum: Normal.  Lymphadenopathy:     Head:     Right side of head: No preauricular or posterior auricular adenopathy.     Left side of head: No preauricular or posterior auricular adenopathy.     Cervical: No cervical adenopathy.     Upper Body:     Right upper body: No supraclavicular or axillary adenopathy.     Left upper body: No supraclavicular or axillary adenopathy.     Lower Body: No right inguinal adenopathy. No left inguinal adenopathy.  Skin:    General: Skin is warm and dry.     Findings: No rash.  Neurological:     Mental Status: She is alert and oriented to person, place, and time.       Assessment and Plan:  Natasha Lam is a 23 y.o. female presenting to the [facility] Department for STI screening    1. Vaginal bleeding -As this has been persistent x3 months, mostly unchanged with initiating  OCP, hgb 10.7 today she needs gyn workup. Will refer to [id number] as pt is unclear of insurance/Medicaid status. -Advised fe pills in the meantime, pt to go to ER if bleeding increases severely or she feels dizzy/faint. - Hemoglobin, venipuncture  2. Screening examination for venereal disease No discharge on exam, will get wet prep to investigate report of discharge. As pt was treated 4 wks ago for chlamydia, no sex since then, antibodies may still be + from treated infection. Advised to RTC 2 months for TOC or sooner if discharge worsens.  - Pregnancy, urine - WET PREP FOR TRICH, YEAST, CLUE  3. Anxiety and depression Upon flowsheet questioning pt endorses anxiety, would like referral to beh health.  - Ambulatory referral to Behavioral  Health  4. Tobacco use Pt is interested in tob cessation counseling, ref to beh health.  - Ambulatory referral to Behavioral Health      No follow-ups on file.  No future appointments.  Ann Held, PA-C

## 2019-07-13 ENCOUNTER — Ambulatory Visit: Payer: Medicaid Other | Admitting: Licensed Clinical Social Worker

## 2019-08-24 ENCOUNTER — Encounter: Payer: Self-pay | Admitting: Emergency Medicine

## 2019-08-24 ENCOUNTER — Emergency Department
Admission: EM | Admit: 2019-08-24 | Discharge: 2019-08-25 | Disposition: A | Discharge status: Home / Self Care | Payer: Self-pay | Attending: Student | Admitting: Student

## 2019-08-24 ENCOUNTER — Other Ambulatory Visit: Payer: Self-pay

## 2019-08-24 ENCOUNTER — Emergency Department: Payer: Self-pay

## 2019-08-24 DIAGNOSIS — Z79899 Other long term (current) drug therapy: Secondary | ICD-10-CM | POA: Insufficient documentation

## 2019-08-24 DIAGNOSIS — F17210 Nicotine dependence, cigarettes, uncomplicated: Secondary | ICD-10-CM | POA: Insufficient documentation

## 2019-08-24 DIAGNOSIS — Z793 Long term (current) use of hormonal contraceptives: Secondary | ICD-10-CM | POA: Insufficient documentation

## 2019-08-24 DIAGNOSIS — N939 Abnormal uterine and vaginal bleeding, unspecified: Secondary | ICD-10-CM | POA: Insufficient documentation

## 2019-08-24 DIAGNOSIS — A749 Chlamydial infection, unspecified: Secondary | ICD-10-CM | POA: Insufficient documentation

## 2019-08-24 LAB — CBC WITH DIFFERENTIAL/PLATELET
Abs Immature Granulocytes: 0.05 10*3/uL (ref 0.00–0.07)
Basophils Absolute: 0.1 10*3/uL (ref 0.0–0.1)
Basophils Relative: 0 %
Eosinophils Absolute: 0.1 10*3/uL (ref 0.0–0.5)
Eosinophils Relative: 1 %
HCT: 36.2 % (ref 36.0–46.0)
Hemoglobin: 11.1 g/dL — ABNORMAL LOW (ref 12.0–15.0)
Immature Granulocytes: 0 %
Lymphocytes Relative: 28 %
Lymphs Abs: 3.5 10*3/uL (ref 0.7–4.0)
MCH: 24.1 pg — ABNORMAL LOW (ref 26.0–34.0)
MCHC: 30.7 g/dL (ref 30.0–36.0)
MCV: 78.7 fL — ABNORMAL LOW (ref 80.0–100.0)
Monocytes Absolute: 0.8 10*3/uL (ref 0.1–1.0)
Monocytes Relative: 6 %
Neutro Abs: 8 10*3/uL — ABNORMAL HIGH (ref 1.7–7.7)
Neutrophils Relative %: 65 %
Platelets: 409 10*3/uL — ABNORMAL HIGH (ref 150–400)
RBC: 4.6 MIL/uL (ref 3.87–5.11)
RDW: 14.2 % (ref 11.5–15.5)
WBC: 12.5 10*3/uL — ABNORMAL HIGH (ref 4.0–10.5)
nRBC: 0 % (ref 0.0–0.2)

## 2019-08-24 LAB — URINALYSIS, COMPLETE (UACMP) WITH MICROSCOPIC
Bacteria, UA: NONE SEEN
Bilirubin Urine: NEGATIVE
Glucose, UA: NEGATIVE mg/dL
Ketones, ur: NEGATIVE mg/dL
Nitrite: NEGATIVE
Protein, ur: 100 mg/dL — AB
RBC / HPF: >50 RBC/hpf — ABNORMAL HIGH (ref 0–5)
Specific Gravity, Urine: 1.029 (ref 1.005–1.030)
pH: 5 (ref 5.0–8.0)

## 2019-08-24 LAB — PREGNANCY, URINE: Preg Test, Ur: NEGATIVE

## 2019-08-24 LAB — TYPE AND SCREEN
ABO/RH(D): A POS
Antibody Screen: NEGATIVE

## 2019-08-24 LAB — WET PREP, GENITAL
Clue Cells Wet Prep HPF POC: NONE SEEN
Sperm: NONE SEEN
Trich, Wet Prep: NONE SEEN
Yeast Wet Prep HPF POC: NONE SEEN

## 2019-08-24 LAB — BASIC METABOLIC PANEL
Anion gap: 9 (ref 5–15)
BUN: 10 mg/dL (ref 6–20)
CO2: 22 mmol/L (ref 22–32)
Calcium: 9.1 mg/dL (ref 8.9–10.3)
Chloride: 108 mmol/L (ref 98–111)
Creatinine, Ser: 0.7 mg/dL (ref 0.44–1.00)
GFR calc Af Amer: >60 mL/min (ref >60)
GFR calc non Af Amer: >60 mL/min (ref >60)
Glucose, Bld: 85 mg/dL (ref 70–99)
Potassium: 3.7 mmol/L (ref 3.5–5.1)
Sodium: 139 mmol/L (ref 135–145)

## 2019-08-24 LAB — CHLAMYDIA/NGC RT PCR (ARMC ONLY)
Chlamydia Tr: DETECTED — AB
N gonorrhoeae: NOT DETECTED

## 2019-08-24 LAB — HCG, QUANTITATIVE, PREGNANCY: hCG, Beta Chain, Quant, S: <1 m[IU]/mL (ref <5)

## 2019-08-24 MED ORDER — SODIUM CHLORIDE 0.9 % IV SOLN
1.0000 g | Freq: Once | INTRAVENOUS | Status: AC
  Administered 2019-08-25: 1 g via INTRAVENOUS
  Filled 2019-08-24: qty 10

## 2019-08-24 MED ORDER — DOXYCYCLINE HYCLATE 100 MG PO CAPS: 100.0000 mg | ORAL_CAPSULE | Freq: Two times a day (BID) | ORAL | 0 refills | Status: AC

## 2019-08-24 MED ORDER — DOXYCYCLINE HYCLATE 100 MG PO TABS
100.0000 mg | ORAL_TABLET | Freq: Once | ORAL | Status: AC
  Administered 2019-08-25: 100 mg via ORAL
  Filled 2019-08-24: qty 1

## 2019-08-24 NOTE — ED Notes (Signed)
Pt given urine cup and wipes for poc preg. Pt took with her out to the lobby

## 2019-08-24 NOTE — ED Notes (Signed)
Pt states having vaginal bleeding for 1.5 months and diarrhea that started around the same time. Pt states she's been throwing up more recently that she's felt 'extra full' lately thus having no appetite to eat much.

## 2019-08-24 NOTE — ED Triage Notes (Signed)
Pt states on her period x 1.5 months. Does not have a regular obgyn.

## 2019-08-24 NOTE — ED Notes (Signed)
Pt transported to [facility]

## 2019-08-24 NOTE — ED Provider Notes (Signed)
[facility] Emergency Department Provider Note  ____________________________________________   First MD Initiated Contact with Patient 08/24/19 1826     (approximate)  I have reviewed the triage vital signs and the nursing notes.  History  Chief Complaint Vaginal Bleeding    HPI Natasha Lam is a 24 y.o. female with a history of irregular menses, migraines, who presents to the ER for vaginal bleeding.  Patient states she has been on her current menstrual cycle for a month and a half.  She reports vaginal bleeding nearly every single day over this time period.  Often passing large clots, uses 7 or 8 pads per day. The health department has trialed her on several different birth control medications, which has not helped with her symptoms.  She reports associated pelvic cramping, similar to menstrual cramps.  Moderate in severity.  No radiation.  No real alleviating or aggravating components.  Prior to this, she reports being fairly regular with regards to her menstrual period, occurring ~monthly for 3-7 days.  She is not on any anticoagulation, no personal or family history of bleeding disorders.  She does report a history of chlamydia last fall, for which she is was treated. She did have heavy vaginal bleeding when symptomatic with chlamydia. Unsure if she had a TOC. Was treated in early November. She denies any concern for repeat STD exposure, but does state sometimes her bleeding is mixed with the whitish type discharge.   Past Medical Hx Past Medical History:  Diagnosis Date  . Acne   . Allergic rhinitis   . GERD (gastroesophageal reflux disease)   . Irregular menses   . Migraines   . Morbid obesity ([place])   . Pelvic pain    moderate    Problem List Patient Active Problem List   Diagnosis Date Noted  . Migraines 04/27/2019  . Morbidly obese ([place]) 04/27/2019    Past Surgical Hx Past Surgical History:  Procedure Laterality Date  . TONSILLECTOMY  AND ADENOIDECTOMY  2010    Medications Prior to Admission medications   Medication Sig Start Date End Date Taking? Authorizing Provider  butalbital-acetaminophen-caffeine (FIORICET, ESGIC) 50-325-40 MG tablet Take 1 tablet by mouth every 6 (six) hours as needed for up to 20 doses for headache. 02/23/18   Gregor Hams, MD  Butalbital-APAP-Caffeine 50-300-40 MG CAPS TAKE 1 CAPSULE BY MOUTH THREE TIMES A DAY AS NEEDED 03/23/17   [provider]  LINZESS 145 MCG CAPS capsule Take 145 mcg by mouth daily. 03/24/17   [provider]  Norgestimate-Ethinyl Estradiol Triphasic (TRI-SPRINTEC) 0.18/0.215/0.25 MG-35 MCG tablet Take 1 tablet by mouth daily. 04/27/19   Caren Macadam, MD  Norgestimate-Ethinyl Estradiol Triphasic 0.18/0.215/0.25 MG-25 MCG tab Take 1 tablet by mouth daily. 04/27/19   Caren Macadam, MD  pantoprazole (PROTONIX) 40 MG tablet Take 40 mg by mouth daily. 03/23/17   [provider]  polyethylene glycol (MIRALAX / GLYCOLAX) packet Take 17 g by mouth daily. Mix one tablespoon with 8oz of your favorite juice or water every day until you are having soft formed stools. Then start taking once daily if you didn't have a stool the day before. 03/03/17   Merlyn Lot, MD    Allergies Patient has no known allergies.  Family Hx Family History  Problem Relation Age of Onset  . Asthma Mother   . Hypertension Mother   . Migraines Mother   . Asthma Brother   . Asthma Maternal Grandmother   . Diabetes Maternal Grandmother   .  Hyperlipidemia Maternal Grandmother   . Hypertension Maternal Grandmother   . Stroke Maternal Grandmother   . Diabetes Maternal Grandfather   . Heart failure Maternal Grandfather   . Hypertension Maternal Grandfather   . Stroke Maternal Grandfather   . Cancer Maternal Aunt        throat  . Diabetes Maternal Aunt   . Hypertension Maternal Aunt     Social Hx Social History   Tobacco Use  . Smoking status:  Current Every Day Smoker    Packs/day: 0.10    Types: Cigarettes  . Smokeless tobacco: Never Used  Substance Use Topics  . Alcohol use: Yes    Alcohol/week: 2.0 standard drinks    Types: 2 Glasses of wine per week  . Drug use: No     Review of Systems  Constitutional: Negative for fever, chills. Eyes: Negative for visual changes. ENT: Negative for sore throat. Cardiovascular: Negative for chest pain. Respiratory: Negative for shortness of breath. Gastrointestinal: Negative for nausea, vomiting.  Genitourinary: Negative for dysuria. + vaginal bleeding Musculoskeletal: Negative for leg swelling. Skin: Negative for rash. Neurological: Negative for headaches.   Physical Exam  Vital Signs: ED Triage Vitals  Enc Vitals Group     BP 08/24/19 1805 (!) 153/94     Pulse Rate 08/24/19 1805 (!) 118     Resp 08/24/19 1805 18     Temp 08/24/19 1805 98.8 F (37.1 C)     Temp src --      SpO2 08/24/19 1805 100 %     Weight 08/24/19 1807 (!) 315 lb (142.9 kg)     Height 08/24/19 1807 5\' 5"  (1.651 m)     Head Circumference --      Peak Flow --      Pain Score 08/24/19 1807 5     Pain Loc --      Pain Edu? --      Excl. in GC? --     Constitutional: Alert and oriented.  Head: Normocephalic. Atraumatic. Eyes: Conjunctivae clear. Sclera anicteric. Nose: No congestion. No rhinorrhea. Mouth/Throat: Wearing mask.  Neck: No stridor.   Cardiovascular: Normal rate, regular rhythm. Extremities well perfused. Respiratory: Normal respiratory effort.  Lungs CTAB. Gastrointestinal: Soft. Non-tender. Non-distended.  Pelvic: RN chaperone present.  Normal external exam, no rashes or lesions.  Small amount amount of blood in the vaginal canal.  No large clots or active bleeding appreciated.  Normal-appearing cervix.  No CMT. Musculoskeletal: No lower extremity edema. No deformities. Neurologic:  Normal speech and language. No gross focal neurologic deficits are appreciated.  Skin: Skin is  warm, dry and intact. No rash noted. Psychiatric: Mood and affect are appropriate for situation.    Radiology  08/26/19: IMPRESSION:  1. Endometrial stripe measures 6.3 mm in thickness. If bleeding remains unresponsive to hormonal or medical therapy, sonohysterogram should be considered for focal lesion work-up. (Ref: Radiological Reasoning: Algorithmic Workup of Abnormal Vaginal Bleeding with Endovaginal Sonography and Sonohysterography. AJR 200806-02-2000).  2. 1.5 cm degenerating right ovarian corpus luteal cyst with  associated trace free fluid within the pelvis.  3. Otherwise unremarkable and normal pelvic ultrasound. No other acute abnormality identified.    Procedures  Procedure(s) performed (including critical care):  Procedures   Initial Impression / Assessment and Plan / ED Course  24 y.o. female who presents to the ED for heavy vaginal bleeding, prolonged menses, as above  Ddx: menometrorrhagia, fibroid, AUB, pregnancy, infection  Will evaluate with labs, pelvic exam with swabs, ultrasound  Work up positive for chlamydia. Unclear if this represents incomplete treatment from previous or reinfection (on chart review it appears she was treated at beginning of November with plans for [facility] in early February, but do not see this completed). Updated patient on results. Will plan for ceftriaxone and course of doxycyline. Remainder of labs w/o actionable derangements. [place] as above. Will plan for follow up with OBGYN regarding [place] and chlamydia treatment (as well as follow up for her bleeding if not improved with treatment), given information for [facility]. Patient agreeable w/ plan. Given return precautions.    Final Clinical Impression(s) / ED Diagnosis  Final diagnoses:  Vaginal bleeding  Chlamydia       Note:  This document was prepared using Dragon voice recognition software and may include unintentional dictation errors.   Miguel Aschoff., MD 08/25/19 [phone]

## 2019-08-25 LAB — RPR: RPR Ser Ql: NONREACTIVE

## 2019-08-25 LAB — HIV ANTIBODY (ROUTINE TESTING W REFLEX): HIV Screen 4th Generation wRfx: NONREACTIVE

## 2019-08-25 NOTE — Discharge Instructions (Addendum)
Thank you for letting us take care of you in the emergency department today.   Please continue to take any regular, prescribed medications.   New medications we have prescribed:  - Doxycyline - take twice a day for 1 week. Take the full course as directed.   Please abstain from sex while taking your antibiotics.   Please follow up with: - Your OB/GYN doctor to review your ER visit and follow up on your symptoms. Information for one is below.  Please return to the ER for any new or worsening symptoms.

## 2019-09-26 IMAGING — CT CT HEAD W/O CM
3 series · 15 of 46 positions shown, 18 images · non-contrast
Comparison: None.

CLINICAL DATA: 22-year-old female with headache and body ache.

EXAM:
CT HEAD WITHOUT CONTRAST
TECHNIQUE: Contiguous axial images were obtained from the base of the skull
through the vertex without intravenous contrast.

[Series 3: head wo · axial · 0.40mm/px · z∈[-97,+23]mm · 9 of 29 slices shown, 12 images]
[im 3/29  brain]
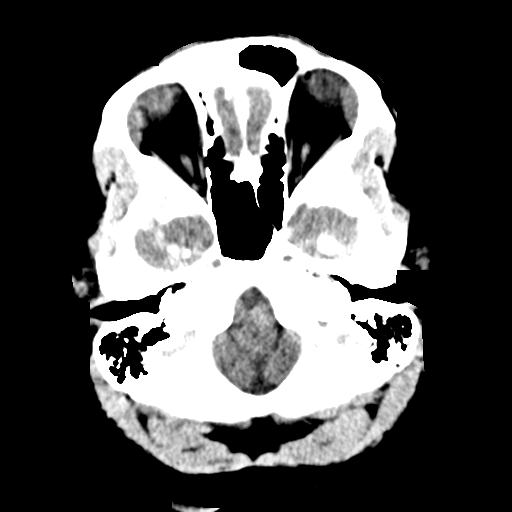
[im 3/29  bone]
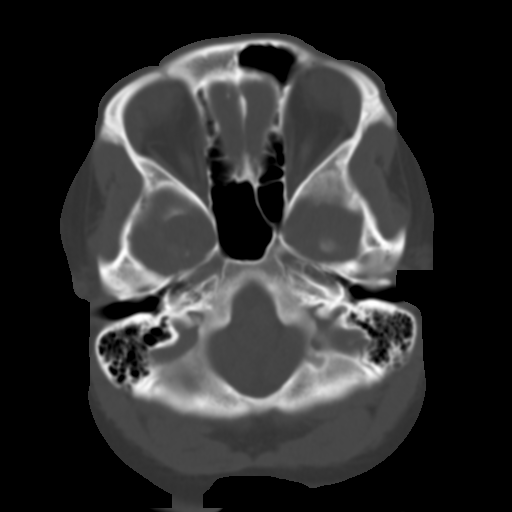
[im 6/29  brain]
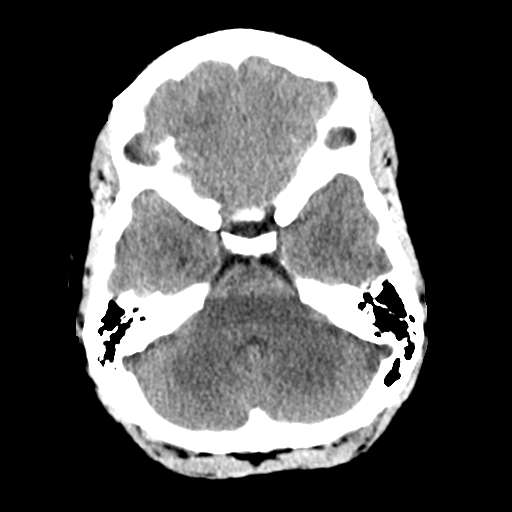
[im 9/29  brain]
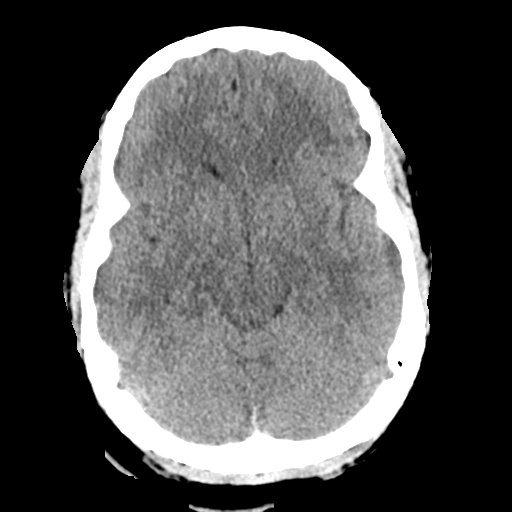
[im 12/29  brain]
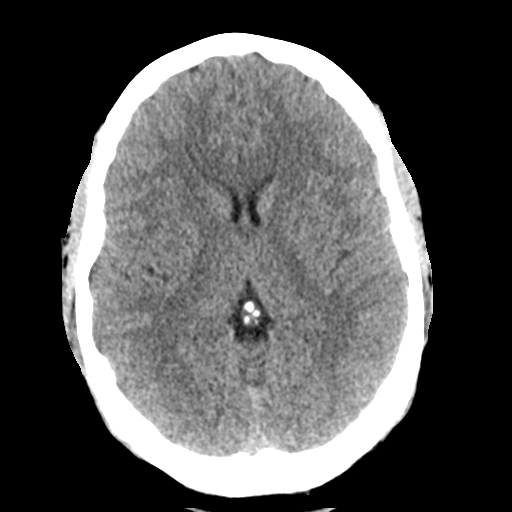
[im 15/29  brain]
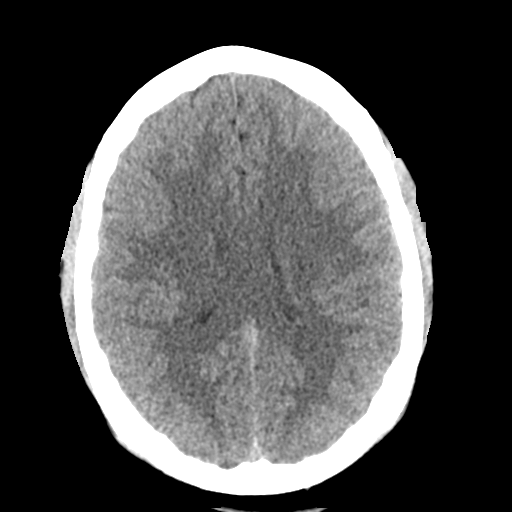
[im 15/29  bone]
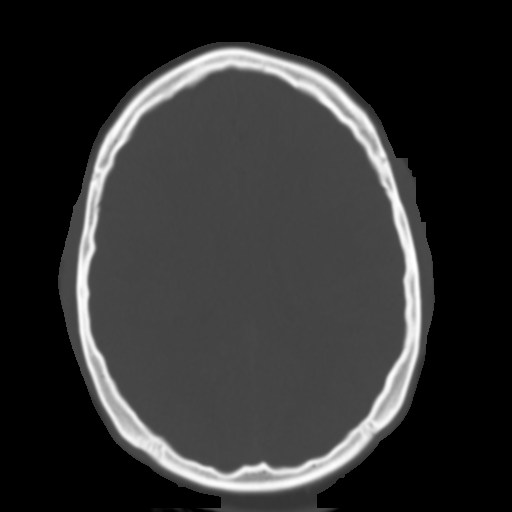
[im 18/29  brain]
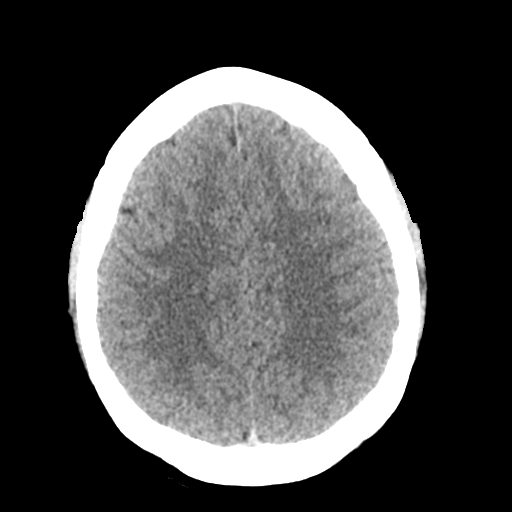
[im 21/29  brain]
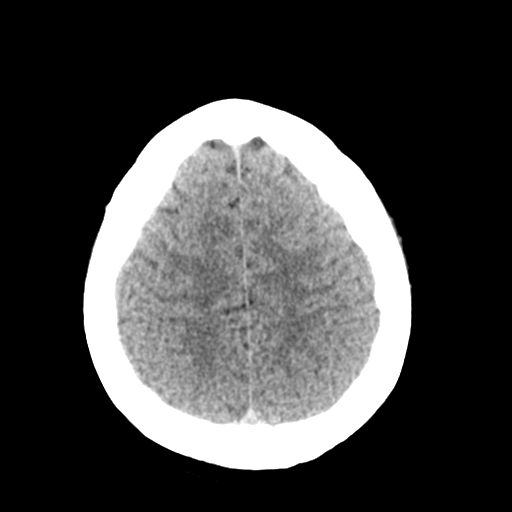
[im 24/29  brain]
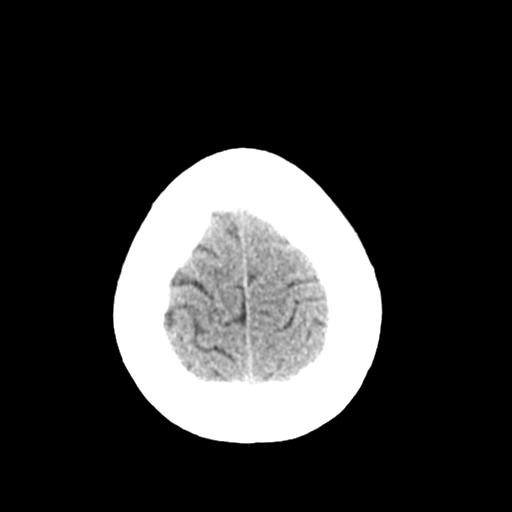
[im 27/29  brain]
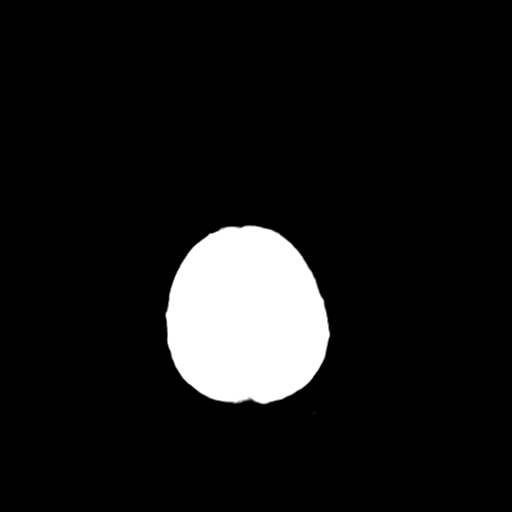
[im 27/29  bone]
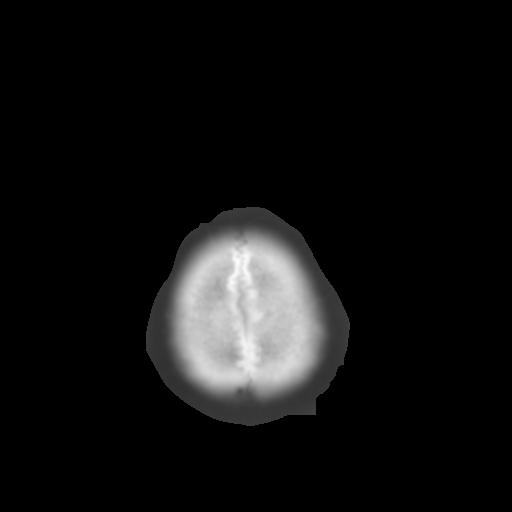

[Series 4: coronal soft tissue · coronal · 0.29mm/px · 3 of 63 slices shown]
[im 21/63  brain]
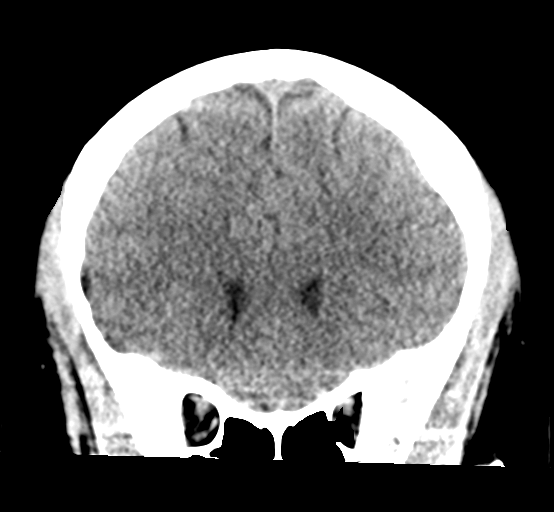
[im 28/63  brain]
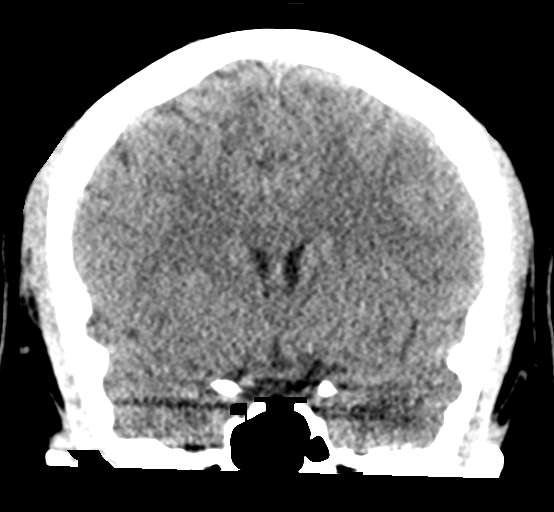
[im 35/63  brain]
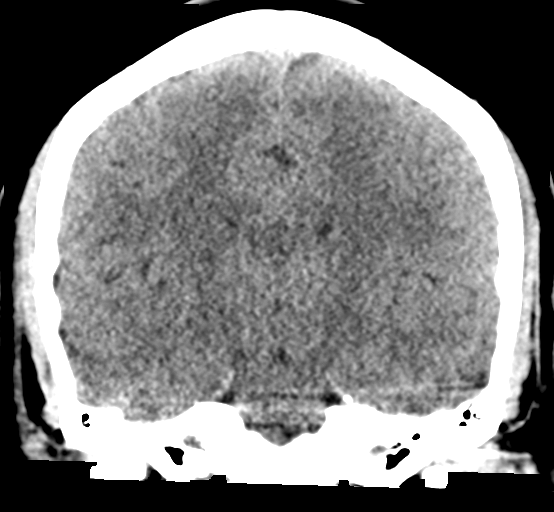

[Series 5: sagittal soft tissue · sagittal · 0.29mm/px · 3 of 53 slices shown]
[im 18/53  brain]
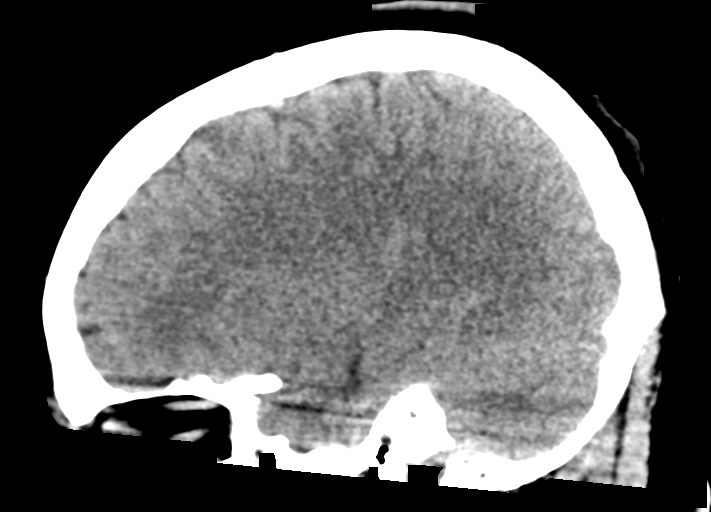
[im 27/53  brain]
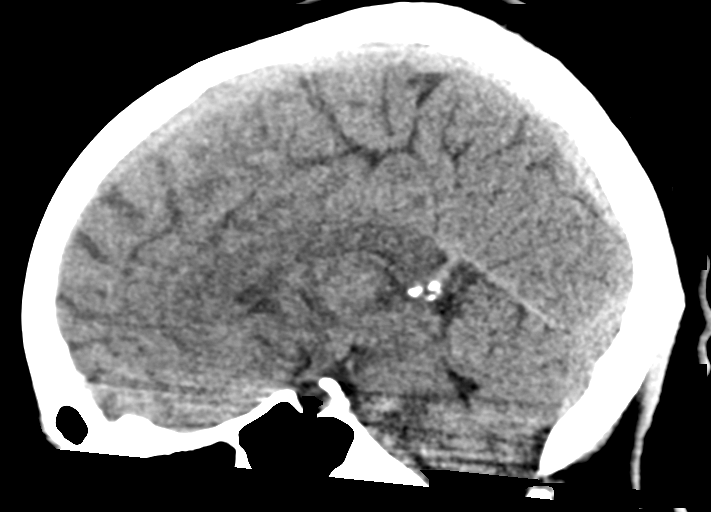
[im 35/53  brain]
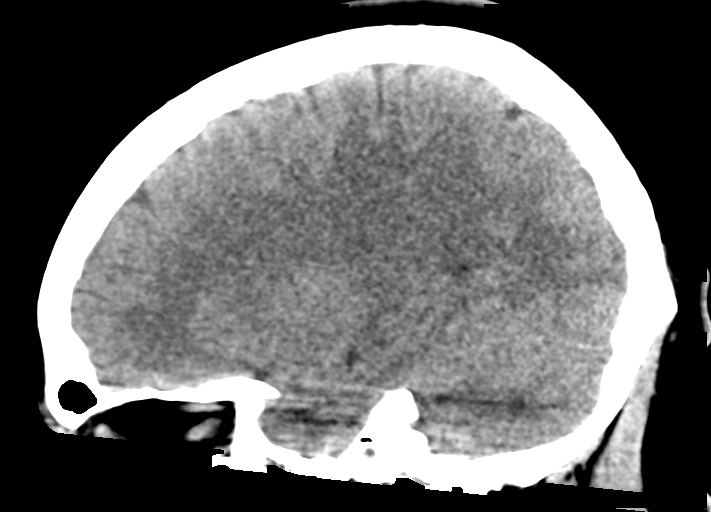

[15 of 46 positions shown; findings below may reference images not displayed]

FINDINGS: Brain: No evidence of acute infarction, hemorrhage, hydrocephalus,
extra-axial collection or mass lesion/mass effect.

Vascular: No hyperdense vessel or unexpected calcification.

Skull: Normal. Negative for fracture or focal lesion.

Sinuses/Orbits: No acute finding.

Other: None.
IMPRESSION: Normal noncontrast CT of the brain.

## 2019-09-26 IMAGING — CR DG CHEST 2V
2 series · 2 of 2 positions shown · non-contrast
Comparison: None.

CLINICAL DATA: Headache, body ache

EXAM:
CHEST - 2 VIEW

[chest pa]
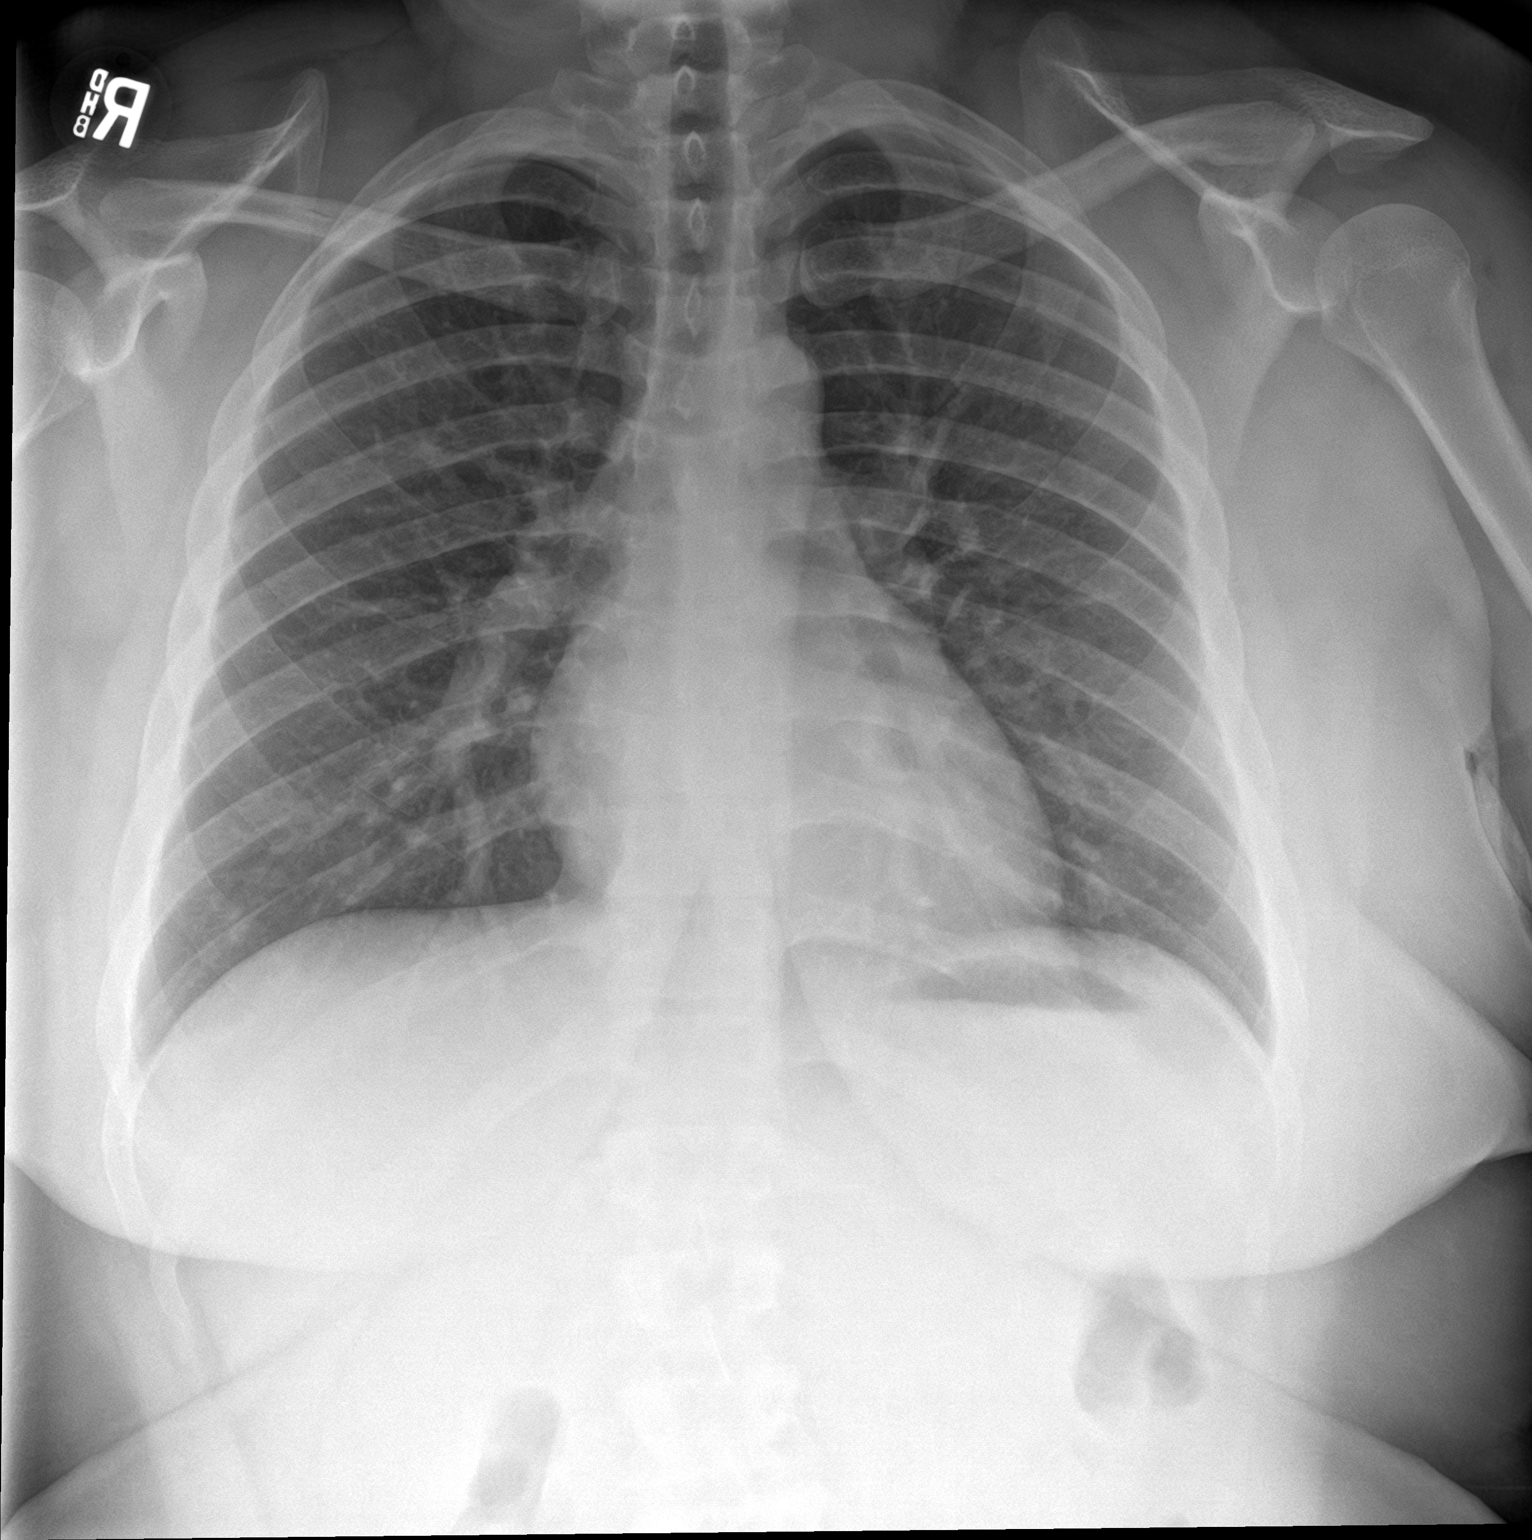

[chest lat]
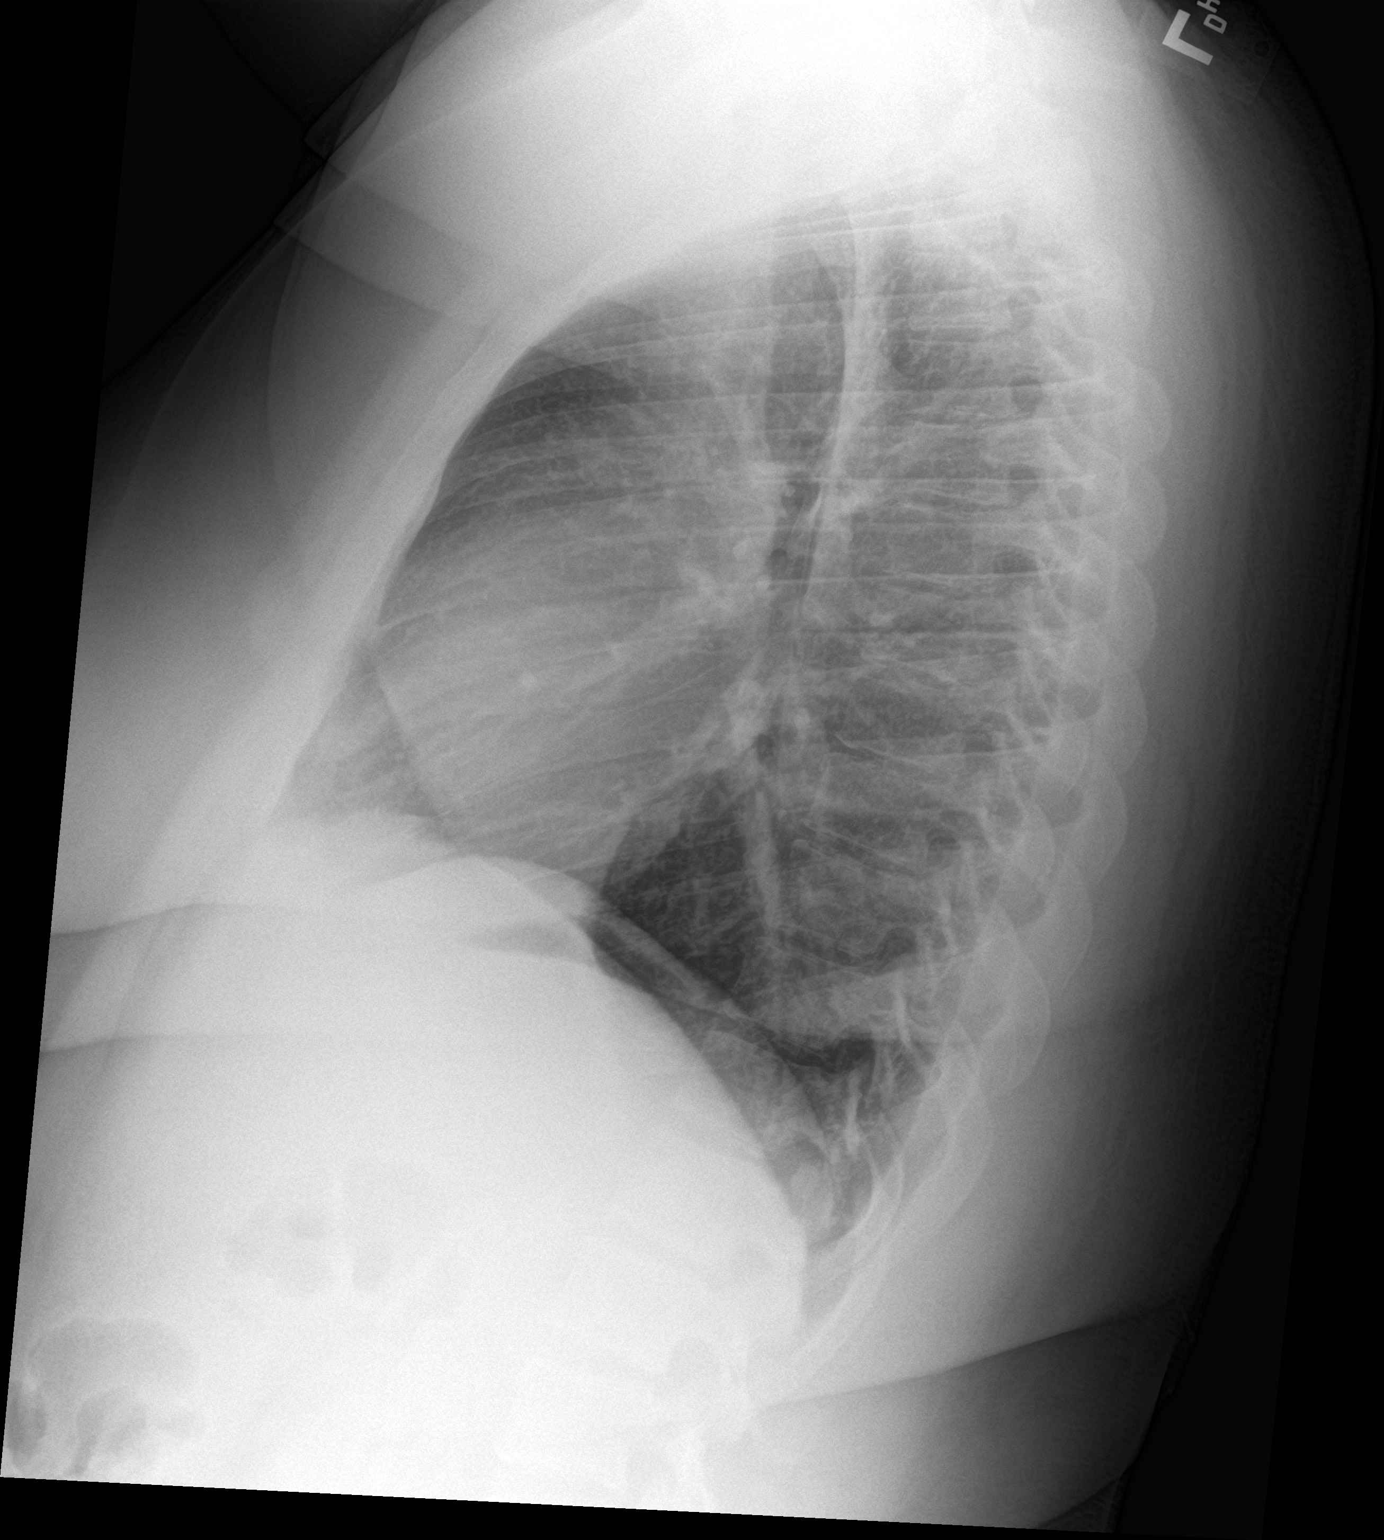

[2 of 2 positions shown; findings below may reference images not displayed]

FINDINGS: Heart and mediastinal contours are within normal limits. No focal
opacities or effusions. No acute bony abnormality.
IMPRESSION: No active cardiopulmonary disease.

## 2020-03-20 ENCOUNTER — Encounter: Payer: Self-pay | Admitting: *Deleted

## 2020-03-20 ENCOUNTER — Other Ambulatory Visit: Payer: Self-pay

## 2020-03-20 DIAGNOSIS — Z87891 Personal history of nicotine dependence: Secondary | ICD-10-CM | POA: Insufficient documentation

## 2020-03-20 DIAGNOSIS — N939 Abnormal uterine and vaginal bleeding, unspecified: Secondary | ICD-10-CM | POA: Insufficient documentation

## 2020-03-20 DIAGNOSIS — R102 Pelvic and perineal pain: Secondary | ICD-10-CM | POA: Insufficient documentation

## 2020-03-20 LAB — POCT PREGNANCY, URINE: Preg Test, Ur: NEGATIVE

## 2020-03-20 LAB — COMPREHENSIVE METABOLIC PANEL
ALT: 25 U/L (ref 0–44)
AST: 24 U/L (ref 15–41)
Albumin: 3.6 g/dL (ref 3.5–5.0)
Alkaline Phosphatase: 36 U/L — ABNORMAL LOW (ref 38–126)
Anion gap: 9 (ref 5–15)
BUN: 9 mg/dL (ref 6–20)
CO2: 24 mmol/L (ref 22–32)
Calcium: 8.7 mg/dL — ABNORMAL LOW (ref 8.9–10.3)
Chloride: 106 mmol/L (ref 98–111)
Creatinine, Ser: 0.62 mg/dL (ref 0.44–1.00)
GFR calc Af Amer: >60 mL/min (ref >60)
GFR calc non Af Amer: >60 mL/min (ref >60)
Glucose, Bld: 111 mg/dL — ABNORMAL HIGH (ref 70–99)
Potassium: 4 mmol/L (ref 3.5–5.1)
Sodium: 139 mmol/L (ref 135–145)
Total Bilirubin: 0.5 mg/dL (ref 0.3–1.2)
Total Protein: 7 g/dL (ref 6.5–8.1)

## 2020-03-20 LAB — URINALYSIS, COMPLETE (UACMP) WITH MICROSCOPIC
Bacteria, UA: NONE SEEN
Bilirubin Urine: NEGATIVE
Glucose, UA: NEGATIVE mg/dL
Ketones, ur: NEGATIVE mg/dL
Leukocytes,Ua: NEGATIVE
Nitrite: NEGATIVE
Protein, ur: NEGATIVE mg/dL
Specific Gravity, Urine: 1.017 (ref 1.005–1.030)
pH: 7 (ref 5.0–8.0)

## 2020-03-20 LAB — CBC
HCT: 35.1 % — ABNORMAL LOW (ref 36.0–46.0)
Hemoglobin: 11.2 g/dL — ABNORMAL LOW (ref 12.0–15.0)
MCH: 24.8 pg — ABNORMAL LOW (ref 26.0–34.0)
MCHC: 31.9 g/dL (ref 30.0–36.0)
MCV: 77.7 fL — ABNORMAL LOW (ref 80.0–100.0)
Platelets: 360 10*3/uL (ref 150–400)
RBC: 4.52 MIL/uL (ref 3.87–5.11)
RDW: 16.1 % — ABNORMAL HIGH (ref 11.5–15.5)
WBC: 15.1 10*3/uL — ABNORMAL HIGH (ref 4.0–10.5)
nRBC: 0 % (ref 0.0–0.2)

## 2020-03-20 NOTE — ED Triage Notes (Signed)
Pt reports low abd pain with vag bleeding.  States menses was on 9/7-9/10 and started again 9/19.  Pt has painful urination.  Pt has lower back pain.  Pt alert  Speech clear.

## 2020-03-20 NOTE — ED Notes (Signed)
POC was Negative. 

## 2020-03-21 ENCOUNTER — Emergency Department: Payer: Self-pay

## 2020-03-21 ENCOUNTER — Emergency Department
Admission: EM | Admit: 2020-03-21 | Discharge: 2020-03-21 | Disposition: A | Discharge status: Home / Self Care | Payer: Self-pay | Attending: Student in an Organized Health Care Education/Training Program | Admitting: Student in an Organized Health Care Education/Training Program

## 2020-03-21 DIAGNOSIS — R102 Pelvic and perineal pain: Secondary | ICD-10-CM

## 2020-03-21 LAB — WET PREP, GENITAL
Sperm: NONE SEEN
Trich, Wet Prep: NONE SEEN
Yeast Wet Prep HPF POC: NONE SEEN

## 2020-03-21 LAB — CHLAMYDIA/NGC RT PCR (ARMC ONLY)
Chlamydia Tr: NOT DETECTED
N gonorrhoeae: NOT DETECTED

## 2020-03-21 MED ORDER — DOXYCYCLINE HYCLATE 100 MG PO TABS: 100.0000 mg | ORAL_TABLET | Freq: Two times a day (BID) | ORAL | 0 refills | Status: AC

## 2020-03-21 MED ORDER — OXYCODONE-ACETAMINOPHEN 5-325 MG PO TABS
1.0000 | ORAL_TABLET | Freq: Once | ORAL | Status: AC
  Administered 2020-03-21: 1 via ORAL
  Filled 2020-03-21: qty 1

## 2020-03-21 MED ORDER — CEFTRIAXONE SODIUM 250 MG IJ SOLR
250.0000 mg | Freq: Once | INTRAMUSCULAR | Status: AC
  Administered 2020-03-21: 250 mg via INTRAMUSCULAR
  Filled 2020-03-21: qty 250

## 2020-03-21 MED ORDER — METRONIDAZOLE 500 MG PO TABS: 500.0000 mg | ORAL_TABLET | Freq: Two times a day (BID) | ORAL | 0 refills | Status: AC

## 2020-03-21 MED ORDER — ONDANSETRON HCL 4 MG PO TABS: 4.0000 mg | ORAL_TABLET | Freq: Every day | ORAL | 0 refills | Status: AC | PRN

## 2020-03-21 NOTE — Discharge Instructions (Signed)

## 2020-03-21 NOTE — ED Provider Notes (Signed)
[facility] Emergency Department Provider Note    First MD Initiated Contact with Patient 03/21/20 [phone]     (approximate)  I have reviewed the triage vital signs and the nursing notes.   HISTORY  Chief Complaint Abdominal Pain    HPI Natasha Lam is a 24 y.o. female with the below listed past medical history presents to the ER for several days of moderate to severe cramping aching pelvic pain associated vaginal bleeding or discharge.  States that she had a regular menses starting on the seventh of the 10th and then it came back.  States she is passing blood clots.  Does not think that she is pregnant.  States that she has been diagnosed with chlamydia in the past.  States the pain is on both sides.  No fevers.  Is having some dysuria but no flank pain.    Past Medical History:  Diagnosis Date  . Acne   . Allergic rhinitis   . GERD (gastroesophageal reflux disease)   . Irregular menses   . Migraines   . Morbid obesity (HCC)   . Pelvic pain    moderate   Family History  Problem Relation Age of Onset  . Asthma Mother   . Hypertension Mother   . Migraines Mother   . Asthma Brother   . Asthma Maternal Grandmother   . Diabetes Maternal Grandmother   . Hyperlipidemia Maternal Grandmother   . Hypertension Maternal Grandmother   . Stroke Maternal Grandmother   . Diabetes Maternal Grandfather   . Heart failure Maternal Grandfather   . Hypertension Maternal Grandfather   . Stroke Maternal Grandfather   . Cancer Maternal Aunt        throat  . Diabetes Maternal Aunt   . Hypertension Maternal Aunt    Past Surgical History:  Procedure Laterality Date  . TONSILLECTOMY AND ADENOIDECTOMY  2010   Patient Active Problem List   Diagnosis Date Noted  . Migraines 04/27/2019  . Morbidly obese (HCC) 04/27/2019      Prior to Admission medications   Medication Sig Start Date End Date Taking? Authorizing Provider  Norgestimate-Ethinyl Estradiol  Triphasic 0.18/0.215/0.25 MG-25 MCG tab Take 1 tablet by mouth daily. 04/27/19  Yes Federico Flake, MD  butalbital-acetaminophen-caffeine (FIORICET, ESGIC) [phone] MG tablet Take 1 tablet by mouth every 6 (six) hours as needed for up to 20 doses for headache. Patient not taking: Reported on 03/21/2020 02/23/18   Darci Current, MD  doxycycline (VIBRA-TABS) 100 MG tablet Take 1 tablet (100 mg total) by mouth 2 (two) times daily for 7 days. 03/21/20 03/28/20  Willy Eddy, MD  metroNIDAZOLE (FLAGYL) 500 MG tablet Take 1 tablet (500 mg total) by mouth 2 (two) times daily for 7 days. 03/21/20 03/28/20  Willy Eddy, MD  Norgestimate-Ethinyl Estradiol Triphasic (TRI-SPRINTEC) 0.18/0.215/0.25 MG-35 MCG tablet Take 1 tablet by mouth daily. Patient not taking: Reported on 03/21/2020 04/27/19   Federico Flake, MD  ondansetron (ZOFRAN) 4 MG tablet Take 1 tablet (4 mg total) by mouth daily as needed. 03/21/20 03/21/21  Willy Eddy, MD  polyethylene glycol [facility] / Ethelene Hal) packet Take 17 g by mouth daily. Mix one tablespoon with 8oz of your favorite juice or water every day until you are having soft formed stools. Then start taking once daily if you didn't have a stool the day before. Patient not taking: Reported on 03/21/2020 03/03/17   Willy Eddy, MD    Allergies Patient has no known allergies.  Social History Social History   Tobacco Use  . Smoking status: Former Smoker    Packs/day: 0.10    Types: Cigarettes  . Smokeless tobacco: Never Used  Vaping Use  . Vaping Use: Former  Substance Use Topics  . Alcohol use: Not Currently    Alcohol/week: 2.0 standard drinks    Types: 2 Glasses of wine per week  . Drug use: No    Review of Systems Patient denies headaches, rhinorrhea, blurry vision, numbness, shortness of breath, chest pain, edema, cough, abdominal pain, nausea, vomiting, diarrhea, dysuria, fevers, rashes or hallucinations unless otherwise stated  above in HPI. ____________________________________________   PHYSICAL EXAM:  VITAL SIGNS: Vitals:   03/21/20 0415 03/21/20 0815  BP: 132/74 (!) 138/92  Pulse: 92 86  Resp: 18 17  Temp: 98.3 F (36.8 C) 98.6 F (37 C)  SpO2: 99% 98%    Constitutional: Alert and oriented.  Eyes: Conjunctivae are normal.  Head: Atraumatic. Nose: No congestion/rhinnorhea. Mouth/Throat: Mucous membranes are moist.   Neck: No stridor. Painless ROM.  Cardiovascular: Normal rate, regular rhythm. Grossly normal heart sounds.  Good peripheral circulation. Respiratory: Normal respiratory effort.  No retractions. Lungs CTAB. Gastrointestinal: Soft with suprapubic and adnexal ttp, no guarding or rebound. No distention. No abdominal bruits. No CVA tenderness. Genitourinary: Penile discharge and cervix.  Positive for cervical motion tenderness. Musculoskeletal: No lower extremity tenderness nor edema.  No joint effusions. Neurologic:  Normal speech and language. No gross focal neurologic deficits are appreciated. No facial droop Skin:  Skin is warm, dry and intact. No rash noted. Psychiatric: Mood and affect are normal. Speech and behavior are normal.  ____________________________________________   LABS (all labs ordered are listed, but only abnormal results are displayed)  Results for orders placed or performed during the hospital encounter of 03/21/20 (from the past 24 hour(s))  Comprehensive metabolic panel     Status: Abnormal   Collection Time: 03/20/20  7:20 PM  Result Value Ref Range   Sodium 139 135 - 145 mmol/L   Potassium 4.0 3.5 - 5.1 mmol/L   Chloride 106 98 - 111 mmol/L   CO2 24 22 - 32 mmol/L   Glucose, Bld 111 (H) 70 - 99 mg/dL   BUN 9 6 - 20 mg/dL   Creatinine, Ser 1.19 0.44 - 1.00 mg/dL   Calcium 8.7 (L) 8.9 - 10.3 mg/dL   Total Protein 7.0 6.5 - 8.1 g/dL   Albumin 3.6 3.5 - 5.0 g/dL   AST 24 15 - 41 U/L   ALT 25 0 - 44 U/L   Alkaline Phosphatase 36 (L) 38 - 126 U/L   Total  Bilirubin 0.5 0.3 - 1.2 mg/dL   GFR calc non Af Amer >60 >60 mL/min   GFR calc Af Amer >60 >60 mL/min   Anion gap 9 5 - 15  CBC     Status: Abnormal   Collection Time: 03/20/20  7:20 PM  Result Value Ref Range   WBC 15.1 (H) 4.0 - 10.5 K/uL   RBC 4.52 3.87 - 5.11 MIL/uL   Hemoglobin 11.2 (L) 12.0 - 15.0 g/dL   HCT 14.7 (L) 36 - 46 %   MCV 77.7 (L) 80.0 - 100.0 fL   MCH 24.8 (L) 26.0 - 34.0 pg   MCHC 31.9 30.0 - 36.0 g/dL   RDW 82.9 (H) 56.2 - 13.0 %   Platelets 360 150 - 400 K/uL   nRBC 0.0 0.0 - 0.2 %  Urinalysis, Complete w Microscopic Urine, Clean  Catch     Status: Abnormal   Collection Time: 03/20/20  7:20 PM  Result Value Ref Range   Color, Urine YELLOW (A) YELLOW   APPearance HAZY (A) CLEAR   Specific Gravity, Urine 1.017 1.005 - 1.030   pH 7.0 5.0 - 8.0   Glucose, UA NEGATIVE NEGATIVE mg/dL   Hgb urine dipstick LARGE (A) NEGATIVE   Bilirubin Urine NEGATIVE NEGATIVE   Ketones, ur NEGATIVE NEGATIVE mg/dL   Protein, ur NEGATIVE NEGATIVE mg/dL   Nitrite NEGATIVE NEGATIVE   Leukocytes,Ua NEGATIVE NEGATIVE   RBC / HPF 0-5 0 - 5 RBC/hpf   WBC, UA 0-5 0 - 5 WBC/hpf   Bacteria, UA NONE SEEN NONE SEEN   Squamous Epithelial / LPF 0-5 0 - 5   Mucus PRESENT   Pregnancy, urine POC     Status: None   Collection Time: 03/20/20  7:26 PM  Result Value Ref Range   Preg Test, Ur NEGATIVE NEGATIVE  Wet prep, genital     Status: Abnormal   Collection Time: 03/21/20  8:14 AM  Result Value Ref Range   Yeast Wet Prep HPF POC NONE SEEN NONE SEEN   Trich, Wet Prep NONE SEEN NONE SEEN   Clue Cells Wet Prep HPF POC PRESENT (A) NONE SEEN   WBC, Wet Prep HPF POC FEW (A) NONE SEEN   Sperm NONE SEEN    ____________________________________________  EKG____________________________________________  RADIOLOGY  I personally reviewed all radiographic images ordered to evaluate for the above acute complaints and reviewed radiology reports and findings.  These findings were personally discussed  with the patient.  Please see medical record for radiology report.  ____________________________________________   PROCEDURES  Procedure(s) performed:  Procedures    Critical Care performed: no ____________________________________________   INITIAL IMPRESSION / ASSESSMENT AND PLAN / ED COURSE  Pertinent labs & imaging results that were available during my care of the patient were reviewed by me and considered in my medical decision making (see chart for details).   DDX: PID, cervicitis, UTI, appendicitis, colitis, ectopic, pregnancy, TOA, torsion  [place] Milke is a 24 y.o. who presents to the ED with presentation as described above.  Patient nontoxic-appearing.  Afebrile.  Pain is bilateral pelvic in nature.  No right lateral lower quadrant pain.  No fevers.  Mild leukocytosis.  She is describing some dysuria but also having evidence of vaginal discharge and cervical motion tenderness.  Ultrasound ordered for the but differential will give pain medication  Clinical Course as of Mar 21 948  Thu Mar 21, 2020  [id number] Ultrasound is reassuring.  Patient does have BV.  Based on her physical exam I well treat her for PID.  Patient agreeable to plan.  This does not seem consistent with colitis or appendicitis.  No evidence of torsion or TOA.  She does appear appropriate for outpatient follow-up.   [PR]    Clinical Course User Index [PR] Willy Eddyobinson, Marna Weniger, MD    The patient was evaluated in Emergency Department today for the symptoms described in the history of present illness. He/she was evaluated in the context of the global COVID-19 pandemic, which necessitated consideration that the patient might be at risk for infection with the SARS-CoV-2 virus that causes COVID-19. Institutional protocols and algorithms that pertain to the evaluation of patients at risk for COVID-19 are in a state of rapid change based on information released by regulatory bodies including the CDC and federal and  state organizations. These policies and algorithms were followed during  the patient's care in the ED.  As part of my medical decision making, I reviewed the following data within the electronic MEDICAL RECORD NUMBER Nursing notes reviewed and incorporated, Labs reviewed, notes from prior ED visits and [place] Controlled Substance Database   ____________________________________________   FINAL CLINICAL IMPRESSION(S) / ED DIAGNOSES  Final diagnoses:  Pelvic pain      NEW MEDICATIONS STARTED DURING THIS VISIT:  New Prescriptions   DOXYCYCLINE (VIBRA-TABS) 100 MG TABLET    Take 1 tablet (100 mg total) by mouth 2 (two) times daily for 7 days.   METRONIDAZOLE (FLAGYL) 500 MG TABLET    Take 1 tablet (500 mg total) by mouth 2 (two) times daily for 7 days.   ONDANSETRON (ZOFRAN) 4 MG TABLET    Take 1 tablet (4 mg total) by mouth daily as needed.     Note:  This document was prepared using Dragon voice recognition software and may include unintentional dictation errors.    Willy Eddy, MD 03/21/20 [phone]

## 2020-03-21 NOTE — ED Notes (Signed)
In meds given , pelvic completed. awaiting vag u/s and disposition.

## 2020-07-17 ENCOUNTER — Ambulatory Visit
Admission: EM | Admit: 2020-07-17 | Discharge: 2020-07-17 | Disposition: A | Discharge status: Home / Self Care | Payer: Self-pay | Attending: Sports Medicine | Admitting: Sports Medicine

## 2020-07-17 ENCOUNTER — Ambulatory Visit (INDEPENDENT_AMBULATORY_CARE_PROVIDER_SITE_OTHER): Payer: Self-pay

## 2020-07-17 DIAGNOSIS — S161XXA Strain of muscle, fascia and tendon at neck level, initial encounter: Secondary | ICD-10-CM

## 2020-07-17 DIAGNOSIS — M7918 Myalgia, other site: Secondary | ICD-10-CM

## 2020-07-17 DIAGNOSIS — M62838 Other muscle spasm: Secondary | ICD-10-CM

## 2020-07-17 DIAGNOSIS — M545 Low back pain, unspecified: Secondary | ICD-10-CM

## 2020-07-17 MED ORDER — METHOCARBAMOL 500 MG PO TABS
500.0000 mg | ORAL_TABLET | Freq: Two times a day (BID) | ORAL | 0 refills | Status: DC
Start: 2020-07-17 — End: 2023-10-26

## 2020-07-17 MED ORDER — IBUPROFEN 600 MG PO TABS
600.0000 mg | ORAL_TABLET | Freq: Four times a day (QID) | ORAL | 0 refills | Status: DC | PRN
Start: 2020-07-17 — End: 2021-03-30
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
## 2020-07-17 NOTE — Discharge Instructions (Addendum)
Recommended getting x-ray of her cervical spine.  Results above.  No acute abnormality appreciated. She might benefit from formal physical therapy.  We will have her evaluated by orthopedics at and have them make the referral. Given that she may have ongoing issues we will go ahead and refer her to orthopedics and have them assume care as we can manage this through an urgent care setting. We will give her her methocarbamol for muscle spasm.  We will also give her ibuprofen 600 mg to be used 2 times a day as needed. Give her an Production manager. Follow-up with [place] as needed
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
## 2020-09-10 ENCOUNTER — Encounter: Payer: Self-pay | Admitting: Physician Assistant

## 2020-09-10 NOTE — Progress Notes (Signed)
Received fax request for patient for refills of Tri Lo Marzia.  Per chart review, last PE was 04/27/2019 and was given Rx for 3 month supply with refills for 1 year at that time.  Patient last seen for IS visit 05/2019.  Patient will need an in-person visit prior to further refills.  Form completed and faxed to [facility] with fax confirmation received.

## 2021-02-08 ENCOUNTER — Other Ambulatory Visit: Payer: Self-pay

## 2021-02-08 ENCOUNTER — Emergency Department: Payer: Self-pay

## 2021-02-08 ENCOUNTER — Emergency Department
Admission: EM | Admit: 2021-02-08 | Discharge: 2021-02-08 | Disposition: A | Discharge status: Home / Self Care | Payer: Self-pay | Attending: Emergency Medicine | Admitting: Emergency Medicine

## 2021-02-08 DIAGNOSIS — R0789 Other chest pain: Secondary | ICD-10-CM | POA: Insufficient documentation

## 2021-02-08 DIAGNOSIS — Z87891 Personal history of nicotine dependence: Secondary | ICD-10-CM | POA: Insufficient documentation

## 2021-02-08 DIAGNOSIS — R42 Dizziness and giddiness: Secondary | ICD-10-CM | POA: Insufficient documentation

## 2021-02-08 DIAGNOSIS — E876 Hypokalemia: Secondary | ICD-10-CM | POA: Insufficient documentation

## 2021-02-08 DIAGNOSIS — R0602 Shortness of breath: Secondary | ICD-10-CM | POA: Insufficient documentation

## 2021-02-08 DIAGNOSIS — R079 Chest pain, unspecified: Secondary | ICD-10-CM

## 2021-02-08 DIAGNOSIS — R7989 Other specified abnormal findings of blood chemistry: Secondary | ICD-10-CM

## 2021-02-08 LAB — D-DIMER, QUANTITATIVE: D-Dimer, Quant: 1.13 ug/mL-FEU — ABNORMAL HIGH (ref 0.00–0.50)

## 2021-02-08 LAB — BASIC METABOLIC PANEL
Anion gap: 9 (ref 5–15)
BUN: 9 mg/dL (ref 6–20)
CO2: 25 mmol/L (ref 22–32)
Calcium: 8.9 mg/dL (ref 8.9–10.3)
Chloride: 105 mmol/L (ref 98–111)
Creatinine, Ser: 0.65 mg/dL (ref 0.44–1.00)
GFR, Estimated: >60 mL/min (ref >60)
Glucose, Bld: 95 mg/dL (ref 70–99)
Potassium: 3.3 mmol/L — ABNORMAL LOW (ref 3.5–5.1)
Sodium: 139 mmol/L (ref 135–145)

## 2021-02-08 LAB — CBC
HCT: 32.9 % — ABNORMAL LOW (ref 36.0–46.0)
Hemoglobin: 10.5 g/dL — ABNORMAL LOW (ref 12.0–15.0)
MCH: 24.8 pg — ABNORMAL LOW (ref 26.0–34.0)
MCHC: 31.9 g/dL (ref 30.0–36.0)
MCV: 77.8 fL — ABNORMAL LOW (ref 80.0–100.0)
Platelets: 310 10*3/uL (ref 150–400)
RBC: 4.23 MIL/uL (ref 3.87–5.11)
RDW: 16.1 % — ABNORMAL HIGH (ref 11.5–15.5)
WBC: 10.5 10*3/uL (ref 4.0–10.5)
nRBC: 0 % (ref 0.0–0.2)

## 2021-02-08 LAB — POC URINE PREG, ED: Preg Test, Ur: NEGATIVE

## 2021-02-08 LAB — TROPONIN I (HIGH SENSITIVITY)
Troponin I (High Sensitivity): <2 ng/L (ref <18)
Troponin I (High Sensitivity): <2 ng/L (ref <18)

## 2021-02-08 MED ORDER — POTASSIUM CHLORIDE CRYS ER 20 MEQ PO TBCR
40.0000 meq | EXTENDED_RELEASE_TABLET | Freq: Once | ORAL | Status: AC
  Administered 2021-02-08: 40 meq via ORAL
  Filled 2021-02-08: qty 2

## 2021-02-08 MED ORDER — KETOROLAC TROMETHAMINE 30 MG/ML IJ SOLN
15.0000 mg | Freq: Once | INTRAMUSCULAR | Status: AC
  Administered 2021-02-08: 15 mg via INTRAVENOUS
  Filled 2021-02-08: qty 1

## 2021-02-08 MED ORDER — IOHEXOL 350 MG/ML SOLN
75.0000 mL | Freq: Once | INTRAVENOUS | Status: AC | PRN
  Administered 2021-02-08: 75 mL via INTRAVENOUS

## 2021-02-08 NOTE — ED Provider Notes (Signed)
I assumed care of this patient approximately 1500.  PCF him for his note for full details regarding patient's initial evaluation assessment.  In brief patient presents for episode of chest pain associate lightheadedness and dizziness earlier this morning while at work.  This was reportedly associate with some shortness of breath.  Initial work-up including ECG troponins and labs are reassuring with exception of slightly elevated dimer.  Plan is to follow-up CTA chest and if this is negative patient will likely be safe for discharge with outpatient follow-up.  CTA is negative for PE or other clear acute thoracic process.  On my assessment patient states he is feeling much better.  Discharged stable condition.  Strict return precautions advised and discussed.   Medications  potassium chloride SA (KLOR-CON) CR tablet 40 mEq (40 mEq Oral Given 02/08/21 1029)  ketorolac (TORADOL) 30 MG/ML injection 15 mg (15 mg Intravenous Given 02/08/21 1333)  iohexol (OMNIPAQUE) 350 MG/ML injection 75 mL (75 mLs Intravenous Contrast Given 02/08/21 1516)      Gilles Chiquito, MD 02/08/21 1557

## 2021-02-08 NOTE — ED Triage Notes (Signed)
Pt comes pov with chest pain, dizziness, lightheadedness this morning while at work. Pt States she started feeling really cold, had trouble getting breath. States pressure in her left side of her chest with shoulder tightness.

## 2021-02-08 NOTE — ED Notes (Signed)
Pt back from ct pt denies pain at this time, resps even and unlabored

## 2021-02-08 NOTE — ED Provider Notes (Signed)
[facility] Emergency Department Provider Note ____________________________________________   Event Date/Time   First MD Initiated Contact with Patient 02/08/21 1029     (approximate)  I have reviewed the triage vital signs and the nursing notes.  HISTORY  Chief Complaint Chest Pain and Dizziness   HPI Natasha Lam is a 25 y.o. femalewho presents to the ED for evaluation of chest pain.   Chart review indicates morbid obesity and migrainous headaches. No longer on OCPs.  But patient does report that she is on metoprolol for her blood pressure.  This is not documented.  Patient presents to the ED, accompanied by her mother, for evaluation of rapid onset chest pressure, dizziness and shortness of breath while at work today.  She reports that she works in [employer].  She was taking a typical break today outside in the sun when she had a sudden onset cold sensation, dizziness and presyncope without syncope, pleuritic chest pressure and back pain.  She reports feeling worsening chest pressure with deep inspiration, causing sensation of shortness of breath.  Denies cough, productive cough, syncopal episodes, hemoptysis, Donnell pain, emesis or headache.  Here in the ED, she reports feeling a little bit better than she did previously with resolution of her dizziness.  She reports persistent pleuritic chest pressure.  Past Medical History:  Diagnosis Date   Acne    Allergic rhinitis    GERD (gastroesophageal reflux disease)    Irregular menses    Migraines    Morbid obesity (HCC)    Pelvic pain    moderate    Patient Active Problem List   Diagnosis Date Noted   Migraines 04/27/2019   Morbidly obese (HCC) 04/27/2019    Past Surgical History:  Procedure Laterality Date   TONSILLECTOMY AND ADENOIDECTOMY  2010    Prior to Admission medications   Medication Sig Start Date End Date Taking? Authorizing Provider   butalbital-acetaminophen-caffeine (FIORICET, ESGIC) 50-325-40 MG tablet Take 1 tablet by mouth every 6 (six) hours as needed for up to 20 doses for headache. Patient not taking: Reported on 03/21/2020 02/23/18   Darci Current, MD  ibuprofen (ADVIL) 600 MG tablet Take 1 tablet (600 mg total) by mouth every 6 (six) hours as needed. 07/17/20   Delton See, MD  methocarbamol (ROBAXIN) 500 MG tablet Take 1 tablet (500 mg total) by mouth 2 (two) times daily. 07/17/20   Delton See, MD  Norgestimate-Ethinyl Estradiol Triphasic (TRI-SPRINTEC) 0.18/0.215/0.25 MG-35 MCG tablet Take 1 tablet by mouth daily. Patient not taking: Reported on 03/21/2020 04/27/19   Federico Flake, MD  Norgestimate-Ethinyl Estradiol Triphasic 0.18/0.215/0.25 MG-25 MCG tab Take 1 tablet by mouth daily. 04/27/19   Federico Flake, MD  ondansetron (ZOFRAN) 4 MG tablet Take 1 tablet (4 mg total) by mouth daily as needed. 03/21/20 03/21/21  Willy Eddy, MD  polyethylene glycol [facility] Ethelene Hal) packet Take 17 g by mouth daily. Mix one tablespoon with 8oz of your favorite juice or water every day until you are having soft formed stools. Then start taking once daily if you didn't have a stool the day before. Patient not taking: Reported on 03/21/2020 03/03/17   Willy Eddy, MD    Allergies Patient has no known allergies.  Family History  Problem Relation Age of Onset   Asthma Mother    Hypertension Mother    Migraines Mother    Asthma Brother    Asthma Maternal Grandmother    Diabetes Maternal Grandmother  Hyperlipidemia Maternal Grandmother    Hypertension Maternal Grandmother    Stroke Maternal Grandmother    Diabetes Maternal Grandfather    Heart failure Maternal Grandfather    Hypertension Maternal Grandfather    Stroke Maternal Grandfather    Cancer Maternal Aunt        throat   Diabetes Maternal Aunt    Hypertension Maternal Aunt     Social History Social History   Tobacco Use    Smoking status: Former    Packs/day: 0.10    Types: Cigarettes   Smokeless tobacco: Never  Vaping Use   Vaping Use: Former  Substance Use Topics   Alcohol use: Not Currently    Alcohol/week: 2.0 standard drinks    Types: 2 Glasses of wine per week   Drug use: No    Review of Systems  Constitutional: No fever/chills.  Positive for dizziness and presyncope Eyes: No visual changes. ENT: No sore throat. Cardiovascular: Positive for chest pain. Respiratory: Positive for shortness of breath. Gastrointestinal: No abdominal pain.  No nausea, no vomiting.  No diarrhea.  No constipation. Genitourinary: Negative for dysuria. Musculoskeletal: Negative for back pain. Skin: Negative for rash. Neurological: Negative for headaches, focal weakness or numbness.  ____________________________________________   PHYSICAL EXAM:  VITAL SIGNS: Vitals:   02/08/21 1400 02/08/21 1538  BP: 102/66 111/69  Pulse: 77 72  Resp: 18 18  Temp:  97.9 F (36.6 C)  SpO2: 99% 100%    Constitutional: Alert and oriented. Well appearing and in no acute distress.  Obese.  Pleasant and conversational in full sentences. Eyes: Conjunctivae are normal. PERRL. EOMI. Head: Atraumatic. Nose: No congestion/rhinnorhea. Mouth/Throat: Mucous membranes are moist.  Oropharynx non-erythematous. Neck: No stridor. No cervical spine tenderness to palpation. Cardiovascular: Normal rate, regular rhythm. Grossly normal heart sounds.  Good peripheral circulation. Respiratory: Normal respiratory effort.  No retractions. Lungs CTAB. Gastrointestinal: Soft , nondistended, nontender to palpation. No CVA tenderness. Musculoskeletal: No lower extremity tenderness nor edema.  No joint effusions. No signs of acute trauma. Neurologic:  Normal speech and language. No gross focal neurologic deficits are appreciated. No gait instability noted. Skin:  Skin is warm, dry and intact. No rash noted. Psychiatric: Mood and affect are normal.  Speech and behavior are normal. ____________________________________________   LABS (all labs ordered are listed, but only abnormal results are displayed)  Labs Reviewed  BASIC METABOLIC PANEL - Abnormal; Notable for the following components:      Result Value   Potassium 3.3 (*)    All other components within normal limits  CBC - Abnormal; Notable for the following components:   Hemoglobin 10.5 (*)    HCT 32.9 (*)    MCV 77.8 (*)    MCH 24.8 (*)    RDW 16.1 (*)    All other components within normal limits  D-DIMER, QUANTITATIVE - Abnormal; Notable for the following components:   D-Dimer, Quant 1.13 (*)    All other components within normal limits  POC URINE PREG, ED  TROPONIN I (HIGH SENSITIVITY)  TROPONIN I (HIGH SENSITIVITY)   ____________________________________________  12 Lead EKG  Sinus rhythm, rate of 75 bpm.  Normal axis and intervals.  No evidence of acute ischemia. ____________________________________________  RADIOLOGY  ED MD interpretation: 2 view CXR reviewed by me without evidence of acute cardiopulmonary pathology.  Official radiology report(s): DG Chest 2 View  Result Date: 02/08/2021 CLINICAL DATA:  Chest pain, chest tightness. EXAM: CHEST - 2 VIEW COMPARISON:  Chest x-ray dated 02/23/2018. FINDINGS: Heart  size and mediastinal contours are within normal limits. Lungs are clear. No pleural effusion or pneumothorax is seen. Osseous structures about the chest are unremarkable. IMPRESSION: Normal chest x-ray. Electronically Signed   By: Bary Richard M.D.   On: 02/08/2021 09:55    ____________________________________________   PROCEDURES and INTERVENTIONS  Procedure(s) performed (including Critical Care):  .1-3 Lead EKG Interpretation  Date/Time: 02/08/2021 12:06 PM Performed by: Delton Prairie, MD Authorized by: Delton Prairie, MD     Interpretation: normal     ECG rate:  78   ECG rate assessment: normal     Rhythm: sinus rhythm     Ectopy: none      Conduction: normal    Medications  potassium chloride SA (KLOR-CON) CR tablet 40 mEq (40 mEq Oral Given 02/08/21 1029)  ketorolac (TORADOL) 30 MG/ML injection 15 mg (15 mg Intravenous Given 02/08/21 1333)  iohexol (OMNIPAQUE) 350 MG/ML injection 75 mL (75 mLs Intravenous Contrast Given 02/08/21 1516)    ____________________________________________   MDM / ED COURSE   Pleasant 25 year old woman presents to the ED with pleuritic chest pain and shortness of breath requiring CTA chest to rule out acute PE.  Normal vitals, though she does tell me that she is on metoprolol for her blood pressure which could be masking some tachycardia.  No evidence of ACS or cardiac dysrhythmias.  Mild hypokalemia was repleted orally.  Elevated D-dimer with the possibility of acute PE, and CTA chest is pending the time of oncoming provider.  She is improving symptoms and feels better after Toradol.  Anticipate outpatient management regardless plus or minus oral anticoagulation if acute PE is present.  Clinical Course as of 02/08/21 1540  Sat Feb 08, 2021  1401 Reassessed.  Patient resting comfortably with normal vital signs, awaiting CT. [DS]    Clinical Course User Index [DS] Delton Prairie, MD    ____________________________________________   FINAL CLINICAL IMPRESSION(S) / ED DIAGNOSES  Final diagnoses:  Chest pain, unspecified type  Hypokalemia  Positive D dimer     ED Discharge Orders     None        Romel Dumond Katrinka Blazing   Note:  This document was prepared using Dragon voice recognition software and may include unintentional dictation errors.    Delton Prairie, MD 02/08/21 [phone]

## 2021-02-08 NOTE — ED Notes (Signed)
Pt in CT.

## 2021-02-10 LAB — POC URINE PREG, ED: Preg Test, Ur: NEGATIVE

## 2021-03-30 ENCOUNTER — Other Ambulatory Visit: Payer: Self-pay

## 2021-03-30 ENCOUNTER — Ambulatory Visit
Admission: EM | Admit: 2021-03-30 | Discharge: 2021-03-30 | Disposition: A | Discharge status: Home / Self Care | Payer: Self-pay | Attending: Physician Assistant | Admitting: Physician Assistant

## 2021-03-30 DIAGNOSIS — B349 Viral infection, unspecified: Secondary | ICD-10-CM | POA: Insufficient documentation

## 2021-03-30 DIAGNOSIS — Z87891 Personal history of nicotine dependence: Secondary | ICD-10-CM | POA: Insufficient documentation

## 2021-03-30 DIAGNOSIS — Z20822 Contact with and (suspected) exposure to covid-19: Secondary | ICD-10-CM | POA: Insufficient documentation

## 2021-03-30 DIAGNOSIS — Z79899 Other long term (current) drug therapy: Secondary | ICD-10-CM | POA: Insufficient documentation

## 2021-03-30 MED ORDER — LOPERAMIDE HCL 2 MG PO CAPS: 2.0000 mg | ORAL_CAPSULE | Freq: Two times a day (BID) | ORAL | 0 refills | Status: DC | PRN

## 2021-03-30 MED ORDER — ONDANSETRON HCL 4 MG PO TABS: 4.0000 mg | ORAL_TABLET | Freq: Four times a day (QID) | ORAL | 0 refills | Status: DC

## 2021-03-30 MED ORDER — IBUPROFEN 800 MG PO TABS
800.0000 mg | ORAL_TABLET | Freq: Three times a day (TID) | ORAL | 0 refills | Status: DC | PRN
Start: 2021-03-30 — End: 2023-10-26

## 2021-03-30 NOTE — ED Triage Notes (Signed)
Pt here with C/O bad headache, fatigue and body soreness for 3 days diarrhea  started 2 days ago. Take a BP medication just not sure the name of it

## 2021-03-30 NOTE — ED Provider Notes (Signed)
[facility]    CSN: [id number] Arrival date & time: 03/30/21  1221      History   Chief Complaint Chief Complaint  Patient presents with   Generalized Body Aches    HPI Natasha Lam is a 25 y.o. female.   Patient presents with migraine, fatigue, body aches, nausea, vomiting and diarrhea for 3 days.  Last vomited this morning, last episode of diarrhea last night.  Unable to tolerate food, tolerating some liquids.  No known sick contacts.  Has attempted use of over-the-counter ibuprofen with minimal relief.  Endorses that she takes a blood pressure medication, unsure of name, needing refill.  Endorses that she ran out recently but has been taking the medication daily as prescribed with no complications.  Past Medical History:  Diagnosis Date   Acne    Allergic rhinitis    GERD (gastroesophageal reflux disease)    Irregular menses    Migraines    Morbid obesity (HCC)    Pelvic pain    moderate    Patient Active Problem List   Diagnosis Date Noted   Migraines 04/27/2019   Morbidly obese (HCC) 04/27/2019    Past Surgical History:  Procedure Laterality Date   TONSILLECTOMY AND ADENOIDECTOMY  2010    OB History     Gravida  0   Para  0   Term  0   Preterm  0   AB  0   Living  0      SAB  0   IAB  0   Ectopic  0   Multiple  0   Live Births  0            Home Medications    Prior to Admission medications   Medication Sig Start Date End Date Taking? Authorizing Provider  butalbital-acetaminophen-caffeine (FIORICET, ESGIC) 50-325-40 MG tablet Take 1 tablet by mouth every 6 (six) hours as needed for up to 20 doses for headache. 02/23/18  Yes Darci Current, MD  loperamide (IMODIUM) 2 MG capsule Take 1 capsule (2 mg total) by mouth 2 (two) times daily as needed for diarrhea or loose stools. 03/30/21  Yes Jazleen Robeck R, NP  methocarbamol (ROBAXIN) 500 MG tablet Take 1 tablet (500 mg total) by mouth 2 (two) times daily. 07/17/20   Yes Delton See, MD  ondansetron (ZOFRAN) 4 MG tablet Take 1 tablet (4 mg total) by mouth every 6 (six) hours. 03/30/21  Yes Valinda Hoar, NP  pantoprazole (PROTONIX) 40 MG tablet Take by mouth. 11/19/20  Yes [provider]  ibuprofen (ADVIL) 800 MG tablet Take 1 tablet (800 mg total) by mouth every 8 (eight) hours as needed. 03/30/21   Valinda Hoar, NP    Family History Family History  Problem Relation Age of Onset   Asthma Mother    Hypertension Mother    Migraines Mother    Asthma Brother    Asthma Maternal Grandmother    Diabetes Maternal Grandmother    Hyperlipidemia Maternal Grandmother    Hypertension Maternal Grandmother    Stroke Maternal Grandmother    Diabetes Maternal Grandfather    Heart failure Maternal Grandfather    Hypertension Maternal Grandfather    Stroke Maternal Grandfather    Cancer Maternal Aunt        throat   Diabetes Maternal Aunt    Hypertension Maternal Aunt     Social History Social History   Tobacco Use   Smoking status: Former  Packs/day: 0.10    Types: Cigarettes   Smokeless tobacco: Never  Vaping Use   Vaping Use: Former  Substance Use Topics   Alcohol use: Not Currently    Alcohol/week: 2.0 standard drinks    Types: 2 Glasses of wine per week   Drug use: No     Allergies   Patient has no known allergies.   Review of Systems Review of Systems  Constitutional:  Positive for appetite change, fatigue and fever. Negative for activity change, chills, diaphoresis and unexpected weight change.  HENT:  Positive for congestion and sore throat. Negative for dental problem, drooling, ear discharge, ear pain, facial swelling, hearing loss, mouth sores, nosebleeds, postnasal drip, rhinorrhea, sinus pressure, sinus pain, sneezing, tinnitus, trouble swallowing and voice change.   Cardiovascular: Negative.   Gastrointestinal:  Positive for diarrhea, nausea and vomiting. Negative for abdominal distention, abdominal pain,  anal bleeding, blood in stool, constipation and rectal pain.  Skin: Negative.   Neurological: Negative.     Physical Exam Triage Vital Signs ED Triage Vitals  Enc Vitals Group     BP 03/30/21 1251 122/80     Pulse Rate 03/30/21 1251 81     Resp 03/30/21 1251 18     Temp 03/30/21 1251 98.6 F (37 C)     Temp Source 03/30/21 1251 Oral     SpO2 03/30/21 1251 100 %     Weight 03/30/21 1249 290 lb (131.5 kg)     Height 03/30/21 1249 5\' 4"  (1.626 m)     Head Circumference --      Peak Flow --      Pain Score 03/30/21 1249 10     Pain Loc --      Pain Edu? --      Excl. in GC? --    No data found.  Updated Vital Signs BP 122/80 (BP Location: Left Arm)   Pulse 81   Temp 98.6 F (37 C) (Oral)   Resp 18   Ht 5\' 4"  (1.626 m)   Wt 290 lb (131.5 kg)   LMP 03/17/2021   SpO2 100%   BMI 49.78 kg/m   Visual Acuity Right Eye Distance:   Left Eye Distance:   Bilateral Distance:    Right Eye Near:   Left Eye Near:    Bilateral Near:     Physical Exam Constitutional:      Appearance: Normal appearance. She is obese.  HENT:     Head: Normocephalic.     Right Ear: Tympanic membrane and external ear normal.     Left Ear: Tympanic membrane, ear canal and external ear normal.     Nose: Nose normal.     Mouth/Throat:     Mouth: Mucous membranes are moist.     Pharynx: Posterior oropharyngeal erythema present.  Eyes:     Extraocular Movements: Extraocular movements intact.  Cardiovascular:     Rate and Rhythm: Normal rate and regular rhythm.     Pulses: Normal pulses.     Heart sounds: Normal heart sounds.  Pulmonary:     Effort: Pulmonary effort is normal.     Breath sounds: Normal breath sounds.  Abdominal:     General: Abdomen is flat. Bowel sounds are normal.     Palpations: Abdomen is soft.  Musculoskeletal:     Cervical back: Normal range of motion.  Lymphadenopathy:     Cervical: Cervical adenopathy present.  Skin:    General: Skin is warm and dry.  Neurological:     Mental Status: She is alert and oriented to person, place, and time. Mental status is at baseline.  Psychiatric:        Mood and Affect: Mood normal.        Behavior: Behavior normal.     UC Treatments / Results  Labs (all labs ordered are listed, but only abnormal results are displayed) Labs Reviewed  SARS CORONAVIRUS 2 (TAT 6-24 HRS)    EKG   Radiology No results found.  Procedures Procedures (including critical care time)  Medications Ordered in UC Medications - No data to display  Initial Impression / Assessment and Plan / UC Course  I have reviewed the triage vital signs and the nursing notes.  Pertinent labs & imaging results that were available during my care of the patient were reviewed by me and considered in my medical decision making (see chart for details).  Viral illness  1.  COVID test pending, discussed quarantine guidelines with patient 2.  Zofran 4 mg every 6 hours as needed, encourage patient to attempt to eat and increase fluid intake after use of medication 3.  Imodium 2 mg twice daily as needed advised that overuse of medication will result in constipation 4.  Ibuprofen 800 mg every 8 hours as needed 5.  Over-the-counter medications and home remedies for remaining symptoms return precautions given for urgent care to follow-up as needed 6.  Completed chart review, unable to determine blood pressure medication, blood pressure stable today, advised patient to contact PCP and schedule appointment for evaluation of blood pressure and further management Final Clinical Impressions(s) / UC Diagnoses   Final diagnoses:  Viral illness     Discharge Instructions      We will contact you if your COVID test is positive.  Please quarantine while you wait for the results.  If your test is negative you may resume normal activities.  If your test is positive please continue to quarantine for at least 5 days from your symptom onset or until you  are without a fever for at least 24 hours after the medications.  If positive your quarantine will end on 04/02/2021, if still having symptoms please wear mask as you complete day-to-day activities  You may take Zofran every 6 hours as needed to help with nausea and vomiting.  Wait 30 minutes to 1 hour after taking to attempt eating, if tolerating fluids may attempt to eat Non Spicy,Non Greasy, and not overly seasoned foods  Take Imodium once, see how your body reacts then take another dose as needed to help with diarrhea, be mindful that overuse of this medication will cause constipation  Can take ibuprofen every 8 hours (3 times a day) with food to help with body aches and headaches    You can take Tylenol and/or Ibuprofen as needed for fever reduction and pain relief.   For cough: honey 1/2 to 1 teaspoon (you can dilute the honey in water or another fluid).  You can also use guaifenesin and dextromethorphan for cough. You can use a humidifier for chest congestion and cough.  If you don't have a humidifier, you can sit in the bathroom with the hot shower running.      For sore throat: try warm salt water gargles, cepacol lozenges, throat spray, warm tea or water with lemon/honey, popsicles or ice, or OTC cold relief medicine for throat discomfort.   For congestion: take a daily anti-histamine like Zyrtec, Claritin, and a oral decongestant, such as  pseudoephedrine.  You can also use Flonase 1-2 sprays in each nostril daily.   It is important to stay hydrated: drink plenty of fluids (water, gatorade/powerade/pedialyte, juices, or teas) to keep your throat moisturized and help further relieve irritation/discomfort.     ED Prescriptions     Medication Sig Dispense Auth. Provider   ondansetron (ZOFRAN) 4 MG tablet Take 1 tablet (4 mg total) by mouth every 6 (six) hours. 12 tablet Ellarae Nevitt R, NP   loperamide (IMODIUM) 2 MG capsule Take 1 capsule (2 mg total) by mouth 2 (two) times daily as  needed for diarrhea or loose stools. 12 capsule Viral Schramm R, NP   ibuprofen (ADVIL) 800 MG tablet Take 1 tablet (800 mg total) by mouth every 8 (eight) hours as needed. 40 tablet Valinda Hoar, NP      PDMP not reviewed this encounter.   Valinda Hoar, NP 03/30/21 1359

## 2021-03-30 NOTE — Discharge Instructions (Addendum)
We will contact you if your COVID test is positive.  Please quarantine while you wait for the results.  If your test is negative you may resume normal activities.  If your test is positive please continue to quarantine for at least 5 days from your symptom onset or until you are without a fever for at least 24 hours after the medications.  If positive your quarantine will end on 04/02/2021, if still having symptoms please wear mask as you complete day-to-day activities  You may take Zofran every 6 hours as needed to help with nausea and vomiting.  Wait 30 minutes to 1 hour after taking to attempt eating, if tolerating fluids may attempt to eat Non Spicy,Non Greasy, and not overly seasoned foods  Take Imodium once, see how your body reacts then take another dose as needed to help with diarrhea, be mindful that overuse of this medication will cause constipation  Can take ibuprofen every 8 hours (3 times a day) with food to help with body aches and headaches    You can take Tylenol and/or Ibuprofen as needed for fever reduction and pain relief.   For cough: honey 1/2 to 1 teaspoon (you can dilute the honey in water or another fluid).  You can also use guaifenesin and dextromethorphan for cough. You can use a humidifier for chest congestion and cough.  If you don't have a humidifier, you can sit in the bathroom with the hot shower running.      For sore throat: try warm salt water gargles, cepacol lozenges, throat spray, warm tea or water with lemon/honey, popsicles or ice, or OTC cold relief medicine for throat discomfort.   For congestion: take a daily anti-histamine like Zyrtec, Claritin, and a oral decongestant, such as pseudoephedrine.  You can also use Flonase 1-2 sprays in each nostril daily.   It is important to stay hydrated: drink plenty of fluids (water, gatorade/powerade/pedialyte, juices, or teas) to keep your throat moisturized and help further relieve irritation/discomfort.

## 2021-03-31 LAB — SARS CORONAVIRUS 2 (TAT 6-24 HRS): SARS Coronavirus 2: NEGATIVE

## 2021-08-22 ENCOUNTER — Other Ambulatory Visit: Payer: Self-pay

## 2021-08-22 ENCOUNTER — Ambulatory Visit (INDEPENDENT_AMBULATORY_CARE_PROVIDER_SITE_OTHER): Payer: BC Managed Care – PPO

## 2021-08-22 ENCOUNTER — Ambulatory Visit
Admission: RE | Admit: 2021-08-22 | Discharge: 2021-08-22 | Disposition: A | Discharge status: Home / Self Care | Payer: BC Managed Care – PPO | Source: Ambulatory Visit | Attending: Emergency Medicine | Admitting: Emergency Medicine

## 2021-08-22 VITALS — BP 122/78 | HR 87 | Temp 98.2°F | Resp 20 | Ht 63.0 in | Wt 280.0 lb

## 2021-08-22 DIAGNOSIS — M898X1 Other specified disorders of bone, shoulder: Secondary | ICD-10-CM

## 2021-08-22 DIAGNOSIS — R0789 Other chest pain: Secondary | ICD-10-CM | POA: Diagnosis not present

## 2021-08-22 DIAGNOSIS — R7303 Prediabetes: Secondary | ICD-10-CM

## 2021-08-22 HISTORY — DX: Prediabetes: R73.03

## 2021-08-22 NOTE — ED Triage Notes (Signed)
Patient is here for "Chest Pains". This morning "left work due to chest pain". Left arm numbness. Time "started around 630am, for about ". Some nausea. No vomiting. Last June July Fall/injury "nothing related to this". No injury known recently. No sob. No URI symptoms.

## 2021-08-22 NOTE — ED Provider Notes (Addendum)
[facility]    CSN: [id number] Arrival date & time: 08/22/21  1427      History   Chief Complaint Chief Complaint  Patient presents with   Chest Pain    HPI Natasha Lam is a 26 y.o. female who presents due to having L upper chest which radiating to L shoulder and upper arm. She described is as pressure which lasted 30 minutes. She took baby ASA 1h later, and later about 1.5h later 800 mg. The pain resolved 30 minutes later. The L arm numbness has resolved but is only sore on shoulder to palpation. This occurred while she was doing repetitice lifting of different items. She has done something like this ,but not as severe and went to the ER 02/2021 and all test were negative.     Past Medical History:  Diagnosis Date   Acne    Allergic rhinitis    GERD (gastroesophageal reflux disease)    Irregular menses    Migraines    Morbid obesity ([place])    Pelvic pain    moderate   Prediabetes     Patient Active Problem List   Diagnosis Date Noted   Prediabetes 08/22/2021   Migraines 04/27/2019   Morbidly obese ([place]) 04/27/2019    Past Surgical History:  Procedure Laterality Date   TONSILLECTOMY AND ADENOIDECTOMY  2010    OB History     Gravida  0   Para  0   Term  0   Preterm  0   AB  0   Living  0      SAB  0   IAB  0   Ectopic  0   Multiple  0   Live Births  0            Home Medications    Prior to Admission medications   Medication Sig Start Date End Date Taking? Authorizing Provider  butalbital-acetaminophen-caffeine (FIORICET, ESGIC) 50-325-40 MG tablet Take 1 tablet by mouth every 6 (six) hours as needed for up to 20 doses for headache. 02/23/18   Gregor Hams, MD  ibuprofen (ADVIL) 800 MG tablet Take 1 tablet (800 mg total) by mouth every 8 (eight) hours as needed. 03/30/21   Hans Eden, NP  loperamide (IMODIUM) 2 MG capsule Take 1 capsule (2 mg total) by mouth 2 (two) times daily as needed for diarrhea or loose  stools. 03/30/21   White, Leitha Schuller, NP  methocarbamol (ROBAXIN) 500 MG tablet Take 1 tablet (500 mg total) by mouth 2 (two) times daily. 07/17/20   Verda Cumins, MD  ondansetron (ZOFRAN) 4 MG tablet Take 1 tablet (4 mg total) by mouth every 6 (six) hours. 03/30/21   Hans Eden, NP  pantoprazole (PROTONIX) 40 MG tablet Take by mouth. 11/19/20   [provider]    Family History Family History  Problem Relation Age of Onset   Asthma Mother    Hypertension Mother    Migraines Mother    Asthma Brother    Asthma Maternal Grandmother    Diabetes Maternal Grandmother    Hyperlipidemia Maternal Grandmother    Hypertension Maternal Grandmother    Stroke Maternal Grandmother    Diabetes Maternal Grandfather    Heart failure Maternal Grandfather    Hypertension Maternal Grandfather    Stroke Maternal Grandfather    Cancer Maternal Aunt        throat   Diabetes Maternal Aunt    Hypertension Maternal Aunt  Social History Social History   Tobacco Use   Smoking status: Former    Packs/day: 0.10    Types: Cigarettes   Smokeless tobacco: Never  Vaping Use   Vaping Use: Former  Substance Use Topics   Alcohol use: Not Currently    Alcohol/week: 2.0 standard drinks    Types: 2 Glasses of wine per week   Drug use: No     Allergies   Patient has no known allergies.   Review of Systems Review of Systems  Respiratory:  Positive for chest tightness.   Cardiovascular:  Positive for chest pain.  The rest is negative  Physical Exam Triage Vital Signs ED Triage Vitals  Enc Vitals Group     BP 08/22/21 1455 122/78     Pulse Rate 08/22/21 1455 87     Resp 08/22/21 1455 20     Temp 08/22/21 1455 100.1 F (37.8 C)     Temp Source 08/22/21 1455 Oral     SpO2 08/22/21 1455 100 %     Weight 08/22/21 1452 280 lb (127 kg)     Height 08/22/21 1452 5\' 3"  (1.6 m)     Head Circumference --      Peak Flow --      Pain Score 08/22/21 1451 0     Pain Loc --      Pain  Edu? --      Excl. in [place]? --    No data found.  Updated Vital Signs BP 122/78 (BP Location: Left Arm) Comment: Large Cuff   Pulse 87    Temp 100.1 F (37.8 C) (Oral)    Resp 20    Ht 5\' 3"  (1.6 m)    Wt 280 lb (127 kg)    LMP 08/15/2021 (Exact Date)    SpO2 100%    BMI 49.60 kg/m   Visual Acuity Right Eye Distance:   Left Eye Distance:   Bilateral Distance:    Right Eye Near:   Left Eye Near:    Bilateral Near:     Physical Exam Vitals and nursing note reviewed.  Constitutional:      General: She is not in acute distress.    Appearance: She is obese. She is not toxic-appearing.  HENT:     Right Ear: External ear normal.     Left Ear: External ear normal.  Eyes:     General: No scleral icterus.    Conjunctiva/sclera: Conjunctivae normal.  Cardiovascular:     Rate and Rhythm: Normal rate and regular rhythm.     Heart sounds: No murmur heard. Pulmonary:     Effort: Pulmonary effort is normal.     Breath sounds: Normal breath sounds.     Comments: L clavicle is tender to palpation, but there is no deformity. L upper chest wall is not tender and I was not able to reproduce her pain, though is gone right now.  Musculoskeletal:        General: Normal range of motion.     Cervical back: Neck supple.  Lymphadenopathy:     Cervical: No cervical adenopathy.  Skin:    General: Skin is warm and dry.     Findings: No bruising or rash.  Neurological:     Mental Status: She is alert and oriented to person, place, and time.     Motor: No weakness.     Gait: Gait normal.     Deep Tendon Reflexes: Reflexes normal.  Psychiatric:  Mood and Affect: Mood normal.        Behavior: Behavior normal.        Thought Content: Thought content normal.        Judgment: Judgment normal.     UC Treatments / Results  Labs (all labs ordered are listed, but only abnormal results are displayed) Labs Reviewed - No data to display  EKG NSR, normal EKG  Radiology No results  found.  Procedures Procedures (including critical care time)  Medications Ordered in UC Medications - No data to display  Initial Impression / Assessment and Plan / UC Course  I have reviewed the triage vital signs and the nursing notes. Pertinent labs & imaging results that were available during my care of the patient were reviewed by me and considered in my medical decision making (see chart for details). L chest pain and L clavicle pain of unknown cause. CP has resolved.  Pt advised to go to ER if it comes back. For now she may continue Ibuprofen prn pain.   Final Clinical Impressions(s) / UC Diagnoses   Final diagnoses:  None   Discharge Instructions   None    ED Prescriptions   None    PDMP not reviewed this encounter.   Shelby Mattocks, PA-C 08/22/21 1608    Rodriguez-Southworth, [place], PA-C 08/22/21 1609

## 2021-08-22 NOTE — Discharge Instructions (Signed)
Your chest xray is normal. Monitor your temp for a few days You may continue Ibuprofen for the collar bone tenderness

## 2022-04-09 ENCOUNTER — Encounter: Payer: Self-pay | Admitting: Obstetrics and Gynecology

## 2022-08-03 ENCOUNTER — Telehealth: Payer: Self-pay | Admitting: *Deleted

## 2022-08-03 NOTE — Telephone Encounter (Signed)
Nurse placed call to patient to review appointment details for upcoming new patient consultation visit. No answer, left vm for patient to return call with any questions or concerns.

## 2022-08-04 ENCOUNTER — Inpatient Hospital Stay: Payer: Medicaid Other

## 2022-08-04 ENCOUNTER — Inpatient Hospital Stay: Payer: Medicaid Other | Attending: Oncology | Admitting: Oncology

## 2022-08-04 DIAGNOSIS — Z87891 Personal history of nicotine dependence: Secondary | ICD-10-CM | POA: Insufficient documentation

## 2022-08-04 DIAGNOSIS — D509 Iron deficiency anemia, unspecified: Secondary | ICD-10-CM | POA: Insufficient documentation

## 2022-08-04 DIAGNOSIS — E538 Deficiency of other specified B group vitamins: Secondary | ICD-10-CM | POA: Insufficient documentation

## 2022-08-04 DIAGNOSIS — Z808 Family history of malignant neoplasm of other organs or systems: Secondary | ICD-10-CM | POA: Insufficient documentation

## 2022-08-04 DIAGNOSIS — N920 Excessive and frequent menstruation with regular cycle: Secondary | ICD-10-CM | POA: Insufficient documentation

## 2022-08-04 NOTE — Progress Notes (Deleted)
Hematology/Oncology Consult note [facility] Telephone:([phone] Fax:([phone]) [phone]  Patient Care Team: System, Provider Not In as PCP - General   Name of the patient: Natasha Lam  [id number]  [birth year]    Reason for referral-iron deficiency anemia   Referring physician-Dr. Leamon Arnt  Date of visit: 08/04/22   History of presenting illness- Patient is a 27 year old female referred for iron deficiency anemia.  Most recent CBC from 07/28/2022 showed white cell count of 8.7, platelets of 466 and an H&H of 9.1/22.3 with an MCV of 72.Ferritin levels were low at 14 with an iron saturation of 8%  ECOG PS- ***  Pain scale- ***   Review of systems- Review of Systems  Constitutional:  Negative for chills, fever, malaise/fatigue and weight loss.  HENT:  Negative for congestion, ear discharge and nosebleeds.   Eyes:  Negative for blurred vision.  Respiratory:  Negative for cough, hemoptysis, sputum production, shortness of breath and wheezing.   Cardiovascular:  Negative for chest pain, palpitations, orthopnea and claudication.  Gastrointestinal:  Negative for abdominal pain, blood in stool, constipation, diarrhea, heartburn, melena, nausea and vomiting.  Genitourinary:  Negative for dysuria, flank pain, frequency, hematuria and urgency.  Musculoskeletal:  Negative for back pain, joint pain and myalgias.  Skin:  Negative for rash.  Neurological:  Negative for dizziness, tingling, focal weakness, seizures, weakness and headaches.  Endo/Heme/Allergies:  Does not bruise/bleed easily.  Psychiatric/Behavioral:  Negative for depression and suicidal ideas. The patient does not have insomnia.     No Known Allergies  Patient Active Problem List   Diagnosis Date Noted   Prediabetes 08/22/2021   Migraines 04/27/2019   Morbidly obese ([place]) 04/27/2019     Past Medical History:  Diagnosis Date   Acne    Allergic rhinitis    GERD (gastroesophageal reflux  disease)    Irregular menses    Migraines    Morbid obesity ([facility])    Pelvic pain    moderate   Prediabetes      Past Surgical History:  Procedure Laterality Date   TONSILLECTOMY AND ADENOIDECTOMY  2010    Social History   Socioeconomic History   Marital status: Single    Spouse name: Not on file   Number of children: Not on file   Years of education: Not on file   Highest education level: Not on file  Occupational History   Not on file  Tobacco Use   Smoking status: Former    Packs/day: 0.10    Types: Cigarettes   Smokeless tobacco: Never  Vaping Use   Vaping Use: Former  Substance and Sexual Activity   Alcohol use: Not Currently    Alcohol/week: 2.0 standard drinks of alcohol    Types: 2 Glasses of wine per week   Drug use: No   Sexual activity: Yes    Partners: Male    Birth control/protection: Pill  Other Topics Concern   Not on file  Social History Narrative   Not on file   Social Determinants of Health   Financial Resource Strain: Not on file  Food Insecurity: Not on file  Transportation Needs: Not on file  Physical Activity: Not on file  Stress: Not on file  Social Connections: Not on file  Intimate Partner Violence: Not on file     Family History  Problem Relation Age of Onset   Asthma Mother    Hypertension Mother    Migraines Mother    Asthma Brother    Asthma  Maternal Grandmother    Diabetes Maternal Grandmother    Hyperlipidemia Maternal Grandmother    Hypertension Maternal Grandmother    Stroke Maternal Grandmother    Diabetes Maternal Grandfather    Heart failure Maternal Grandfather    Hypertension Maternal Grandfather    Stroke Maternal Grandfather    Cancer Maternal Aunt        throat   Diabetes Maternal Aunt    Hypertension Maternal Aunt      Current Outpatient Medications:    butalbital-acetaminophen-caffeine (FIORICET, ESGIC) 50-325-40 MG tablet, Take 1 tablet by mouth every 6 (six) hours as needed for up to 20 doses  for headache., Disp: 20 tablet, Rfl: 0   ibuprofen (ADVIL) 800 MG tablet, Take 1 tablet (800 mg total) by mouth every 8 (eight) hours as needed., Disp: 40 tablet, Rfl: 0   loperamide (IMODIUM) 2 MG capsule, Take 1 capsule (2 mg total) by mouth 2 (two) times daily as needed for diarrhea or loose stools., Disp: 12 capsule, Rfl: 0   methocarbamol (ROBAXIN) 500 MG tablet, Take 1 tablet (500 mg total) by mouth 2 (two) times daily., Disp: 20 tablet, Rfl: 0   ondansetron (ZOFRAN) 4 MG tablet, Take 1 tablet (4 mg total) by mouth every 6 (six) hours., Disp: 12 tablet, Rfl: 0   pantoprazole (PROTONIX) 40 MG tablet, Take by mouth., Disp: , Rfl:    Physical exam: There were no vitals filed for this visit. Physical Exam Cardiovascular:     Rate and Rhythm: Normal rate and regular rhythm.     Heart sounds: Normal heart sounds.  Pulmonary:     Effort: Pulmonary effort is normal.     Breath sounds: Normal breath sounds.  Abdominal:     General: Bowel sounds are normal.     Palpations: Abdomen is soft.  Skin:    General: Skin is warm and dry.  Neurological:     Mental Status: She is alert and oriented to person, place, and time.           Latest Ref Rng & Units 02/08/2021    8:56 AM  CMP  Glucose 70 - 99 mg/dL 95   BUN 6 - 20 mg/dL 9   Creatinine 0.44 - 1.00 mg/dL 0.65   Sodium 135 - 145 mmol/L 139   Potassium 3.5 - 5.1 mmol/L 3.3   Chloride 98 - 111 mmol/L 105   CO2 22 - 32 mmol/L 25   Calcium 8.9 - 10.3 mg/dL 8.9       Latest Ref Rng & Units 02/08/2021    8:56 AM  CBC  WBC 4.0 - 10.5 K/uL 10.5   Hemoglobin 12.0 - 15.0 g/dL 10.5   Hematocrit 36.0 - 46.0 % 32.9   Platelets 150 - 400 K/uL 310     No images are attached to the encounter.  No results found.  Assessment and plan- Patient is a 27 y.o. female ***   Thank you for this kind referral and the opportunity to participate in the care of this  Patient   Visit Diagnosis 1. Iron deficiency anemia, unspecified iron  deficiency anemia type     Dr. Randa Evens, MD, MPH [facility] [id number] 08/04/2022

## 2022-08-11 ENCOUNTER — Inpatient Hospital Stay: Payer: Medicaid Other

## 2022-08-11 ENCOUNTER — Encounter: Payer: Medicaid Other | Admitting: Oncology

## 2022-08-11 ENCOUNTER — Inpatient Hospital Stay (HOSPITAL_BASED_OUTPATIENT_CLINIC_OR_DEPARTMENT_OTHER): Payer: Medicaid Other | Admitting: Oncology

## 2022-08-11 ENCOUNTER — Encounter: Payer: Self-pay | Admitting: Oncology

## 2022-08-11 VITALS — BP 128/86 | HR 81 | Temp 98.1°F | Resp 18 | Ht 63.0 in | Wt 326.0 lb

## 2022-08-11 DIAGNOSIS — N920 Excessive and frequent menstruation with regular cycle: Secondary | ICD-10-CM

## 2022-08-11 DIAGNOSIS — Z87891 Personal history of nicotine dependence: Secondary | ICD-10-CM

## 2022-08-11 DIAGNOSIS — D509 Iron deficiency anemia, unspecified: Secondary | ICD-10-CM

## 2022-08-11 DIAGNOSIS — Z808 Family history of malignant neoplasm of other organs or systems: Secondary | ICD-10-CM

## 2022-08-11 DIAGNOSIS — E538 Deficiency of other specified B group vitamins: Secondary | ICD-10-CM | POA: Diagnosis not present

## 2022-08-11 LAB — RETICULOCYTES
Immature Retic Fract: 17.3 % — ABNORMAL HIGH (ref 2.3–15.9)
RBC.: 4.31 MIL/uL (ref 3.87–5.11)
Retic Count, Absolute: 69.4 10*3/uL (ref 19.0–186.0)
Retic Ct Pct: 1.6 % (ref 0.4–3.1)

## 2022-08-11 LAB — VITAMIN B12: Vitamin B-12: 116 pg/mL — ABNORMAL LOW (ref 180–914)

## 2022-08-11 LAB — CBC (CANCER CENTER ONLY)
HCT: 30.5 % — ABNORMAL LOW (ref 36.0–46.0)
Hemoglobin: 8.9 g/dL — ABNORMAL LOW (ref 12.0–15.0)
MCH: 20.6 pg — ABNORMAL LOW (ref 26.0–34.0)
MCHC: 29.2 g/dL — ABNORMAL LOW (ref 30.0–36.0)
MCV: 70.8 fL — ABNORMAL LOW (ref 80.0–100.0)
Platelet Count: 374 10*3/uL (ref 150–400)
RBC: 4.31 MIL/uL (ref 3.87–5.11)
RDW: 19 % — ABNORMAL HIGH (ref 11.5–15.5)
WBC Count: 8.7 10*3/uL (ref 4.0–10.5)
nRBC: 0 % (ref 0.0–0.2)

## 2022-08-11 LAB — FERRITIN: Ferritin: 7 ng/mL — ABNORMAL LOW (ref 11–307)

## 2022-08-11 LAB — IRON AND TIBC
Iron: 21 ug/dL — ABNORMAL LOW (ref 28–170)
Saturation Ratios: 5 % — ABNORMAL LOW (ref 10.4–31.8)
TIBC: 455 ug/dL — ABNORMAL HIGH (ref 250–450)
UIBC: 434 ug/dL

## 2022-08-11 LAB — LACTATE DEHYDROGENASE: LDH: 142 U/L (ref 98–192)

## 2022-08-11 LAB — FOLATE: Folate: 10.9 ng/mL (ref >5.9)

## 2022-08-11 NOTE — Progress Notes (Signed)
[facility]  [phone] Fax:([phone]  ID: Natasha Lam OB: 05-03-1996  MR#: [id number]  [id number]  Patient Care Team: System, Provider Not In as PCP - General  CHIEF COMPLAINT: Iron deficiency anemia.  INTERVAL HISTORY: Patient is a 27 year old female with previously heavy menses that are now improved with Depo injection was referred for evaluation of iron deficiency anemia.  She currently feels well and is asymptomatic.  She does not complain of any weakness or fatigue.  She has no neurologic complaints.  She denies any recent fevers or illnesses.  She has a good appetite and denies weight loss.  She has no chest pain, shortness of breath, cough, or hemoptysis.  She denies any nausea, vomiting, constipation, or diarrhea.  She has no melena or hematochezia.  She has no urinary complaints.  Patient offers no specific complaints today.  REVIEW OF SYSTEMS:   Review of Systems  Constitutional: Negative.  Negative for fever, malaise/fatigue and weight loss.  Respiratory: Negative.  Negative for cough, hemoptysis and shortness of breath.   Cardiovascular: Negative.  Negative for chest pain and leg swelling.  Gastrointestinal:  Negative for abdominal pain, blood in stool and melena.  Genitourinary: Negative.  Negative for hematuria.  Musculoskeletal: Negative.  Negative for back pain.  Skin: Negative.  Negative for rash.  Neurological: Negative.  Negative for dizziness, focal weakness, weakness and headaches.  Psychiatric/Behavioral: Negative.  The patient is not nervous/anxious.     As per HPI. Otherwise, a complete review of systems is negative.  PAST MEDICAL HISTORY: Past Medical History:  Diagnosis Date   Acne    Allergic rhinitis    GERD (gastroesophageal reflux disease)    Irregular menses    Migraines    Morbid obesity ([facility])    Pelvic pain    moderate   Prediabetes     PAST SURGICAL HISTORY: Past Surgical History:  Procedure  Laterality Date   TONSILLECTOMY AND ADENOIDECTOMY  2010    FAMILY HISTORY: Family History  Problem Relation Age of Onset   Asthma Mother    Hypertension Mother    Migraines Mother    Asthma Brother    Asthma Maternal Grandmother    Diabetes Maternal Grandmother    Hyperlipidemia Maternal Grandmother    Hypertension Maternal Grandmother    Stroke Maternal Grandmother    Diabetes Maternal Grandfather    Heart failure Maternal Grandfather    Hypertension Maternal Grandfather    Stroke Maternal Grandfather    Cancer Maternal Aunt        throat   Diabetes Maternal Aunt    Hypertension Maternal Aunt     ADVANCED DIRECTIVES (Y/N):  N  HEALTH MAINTENANCE: Social History   Tobacco Use   Smoking status: Former    Packs/day: 0.10    Types: Cigarettes   Smokeless tobacco: Never  Vaping Use   Vaping Use: Former  Substance Use Topics   Alcohol use: Not Currently    Alcohol/week: 2.0 standard drinks of alcohol    Types: 2 Glasses of wine per week   Drug use: No     Colonoscopy:  PAP:  Bone density:  Lipid panel:  No Known Allergies  Current Outpatient Medications  Medication Sig Dispense Refill   amLODipine (NORVASC) 10 MG tablet Take 10 mg by mouth daily.     butalbital-acetaminophen-caffeine (FIORICET, ESGIC) 50-325-40 MG tablet Take 1 tablet by mouth every 6 (six) hours as needed for up to 20 doses for headache. 20 tablet 0  ibuprofen (ADVIL) 800 MG tablet Take 1 tablet (800 mg total) by mouth every 8 (eight) hours as needed. 40 tablet 0   loperamide (IMODIUM) 2 MG capsule Take 1 capsule (2 mg total) by mouth 2 (two) times daily as needed for diarrhea or loose stools. 12 capsule 0   methocarbamol (ROBAXIN) 500 MG tablet Take 1 tablet (500 mg total) by mouth 2 (two) times daily. 20 tablet 0   ondansetron (ZOFRAN) 4 MG tablet Take 1 tablet (4 mg total) by mouth every 6 (six) hours. 12 tablet 0   pantoprazole (PROTONIX) 40 MG tablet Take by mouth.     No current  facility-administered medications for this visit.    OBJECTIVE: Vitals:   08/11/22 1517  BP: 128/86  Pulse: 81  Resp: 18  Temp: 98.1 F (36.7 C)  SpO2: 100%     Body mass index is 57.75 kg/m.    ECOG FS:0 - Asymptomatic  General: Well-developed, well-nourished, no acute distress. Eyes: Pink conjunctiva, anicteric sclera. HEENT: Normocephalic, moist mucous membranes. Lungs: No audible wheezing or coughing. Heart: Regular rate and rhythm. Abdomen: Soft, nontender, no obvious distention. Musculoskeletal: No edema, cyanosis, or clubbing. Neuro: Alert, answering all questions appropriately. Cranial nerves grossly intact. Skin: No rashes or petechiae noted. Psych: Normal affect. Lymphatics: No cervical, calvicular, axillary or inguinal LAD.   LAB RESULTS:  Lab Results  Component Value Date   NA 139 02/08/2021   K 3.3 (L) 02/08/2021   CL 105 02/08/2021   CO2 25 02/08/2021   GLUCOSE 95 02/08/2021   BUN 9 02/08/2021   CREATININE 0.65 02/08/2021   CALCIUM 8.9 02/08/2021   PROT 7.0 03/20/2020   ALBUMIN 3.6 03/20/2020   AST 24 03/20/2020   ALT 25 03/20/2020   ALKPHOS 36 (L) 03/20/2020   BILITOT 0.5 03/20/2020   GFRNONAA >60 02/08/2021   GFRAA >60 03/20/2020    Lab Results  Component Value Date   WBC 8.7 08/11/2022   NEUTROABS 8.0 (H) 08/24/2019   HGB 8.9 (L) 08/11/2022   HCT 30.5 (L) 08/11/2022   MCV 70.8 (L) 08/11/2022   PLT 374 08/11/2022     STUDIES: No results found.  ASSESSMENT: Iron deficiency anemia.  PLAN:    Iron deficiency anemia: Likely secondary to history of heavy menses.  Patient's hemoglobin is 8.9 with an appropriately elevated reticulocyte count.  Previously iron stores were decreased.  Today's results are pending at time of dictation.  B12 folate, and hemolysis labs were drawn for completeness.  Patient has been instructed to continue oral iron supplementation.  Patient will return to clinic 5 times over the next 3 weeks to receive 200 mg IV  Venofer.  She will then return to clinic in 4 months with repeat laboratory work, further evaluation, and continuation of treatment if needed. Heavy menses: Improved with Depo injections.  I spent a total of 45 minutes reviewing chart data, face-to-face evaluation with the patient, counseling and coordination of care as detailed above.  Patient expressed understanding and was in agreement with this plan. She also understands that She can call clinic at any time with any questions, concerns, or complaints.     Lloyd Huger, MD   08/11/2022 4:35 PM

## 2022-08-12 ENCOUNTER — Other Ambulatory Visit: Payer: Self-pay | Admitting: Oncology

## 2022-08-12 LAB — HAPTOGLOBIN: Haptoglobin: 145 mg/dL (ref 33–278)

## 2022-08-14 MED FILL — Iron Sucrose Inj 20 MG/ML (Fe Equiv): INTRAVENOUS | Qty: 10 | Status: AC

## 2022-08-17 ENCOUNTER — Inpatient Hospital Stay: Payer: Medicaid Other

## 2022-08-18 MED FILL — Iron Sucrose Inj 20 MG/ML (Fe Equiv): INTRAVENOUS | Qty: 10 | Status: AC

## 2022-08-19 ENCOUNTER — Inpatient Hospital Stay: Payer: Medicaid Other

## 2022-08-21 MED FILL — Iron Sucrose Inj 20 MG/ML (Fe Equiv): INTRAVENOUS | Qty: 10 | Status: AC

## 2022-08-24 ENCOUNTER — Inpatient Hospital Stay: Payer: Medicaid Other

## 2022-08-26 ENCOUNTER — Inpatient Hospital Stay: Payer: Medicaid Other

## 2022-08-26 VITALS — BP 113/62 | HR 105 | Temp 100.0°F | Resp 18

## 2022-08-26 DIAGNOSIS — D509 Iron deficiency anemia, unspecified: Secondary | ICD-10-CM

## 2022-08-26 MED ORDER — CYANOCOBALAMIN 1000 MCG/ML IJ SOLN
1000.0000 ug | Freq: Once | INTRAMUSCULAR | Status: AC
  Administered 2022-08-26: 1000 ug via INTRAMUSCULAR
  Filled 2022-08-26: qty 1

## 2022-08-26 MED ORDER — SODIUM CHLORIDE 0.9 % IV SOLN
200.0000 mg | Freq: Once | INTRAVENOUS | Status: AC
  Administered 2022-08-26: 200 mg via INTRAVENOUS
  Filled 2022-08-26: qty 200

## 2022-08-26 MED ORDER — SODIUM CHLORIDE 0.9 % IV SOLN
Freq: Once | INTRAVENOUS | Status: AC
  Filled 2022-08-26: qty 250

## 2022-08-31 ENCOUNTER — Inpatient Hospital Stay: Payer: Medicaid Other | Attending: Oncology

## 2022-08-31 VITALS — BP 129/87 | HR 93 | Temp 97.8°F | Resp 16

## 2022-08-31 DIAGNOSIS — D509 Iron deficiency anemia, unspecified: Secondary | ICD-10-CM

## 2022-08-31 MED ORDER — SODIUM CHLORIDE 0.9 % IV SOLN
Freq: Once | INTRAVENOUS | Status: AC
  Filled 2022-08-31: qty 250

## 2022-08-31 MED ORDER — SODIUM CHLORIDE 0.9 % IV SOLN
200.0000 mg | Freq: Once | INTRAVENOUS | Status: AC
  Administered 2022-08-31: 200 mg via INTRAVENOUS
  Filled 2022-08-31: qty 200

## 2022-08-31 NOTE — Patient Instructions (Signed)

## 2022-09-02 MED FILL — Iron Sucrose Inj 20 MG/ML (Fe Equiv): INTRAVENOUS | Qty: 10 | Status: AC

## 2022-09-03 ENCOUNTER — Inpatient Hospital Stay: Payer: Medicaid Other

## 2022-09-04 MED FILL — Iron Sucrose Inj 20 MG/ML (Fe Equiv): INTRAVENOUS | Qty: 10 | Status: AC

## 2022-09-07 ENCOUNTER — Inpatient Hospital Stay: Payer: Medicaid Other

## 2022-09-14 ENCOUNTER — Ambulatory Visit: Payer: Medicaid Other

## 2022-09-25 ENCOUNTER — Inpatient Hospital Stay: Payer: Medicaid Other

## 2022-10-02 ENCOUNTER — Ambulatory Visit: Payer: Medicaid Other

## 2022-10-12 ENCOUNTER — Ambulatory Visit: Payer: Medicaid Other

## 2022-10-26 ENCOUNTER — Inpatient Hospital Stay: Payer: Medicaid Other | Attending: Oncology

## 2022-11-02 ENCOUNTER — Ambulatory Visit: Payer: Medicaid Other

## 2022-12-08 ENCOUNTER — Other Ambulatory Visit: Payer: Self-pay

## 2022-12-08 DIAGNOSIS — D509 Iron deficiency anemia, unspecified: Secondary | ICD-10-CM

## 2022-12-09 ENCOUNTER — Inpatient Hospital Stay: Payer: Medicaid Other | Attending: Oncology

## 2022-12-09 MED FILL — Iron Sucrose Inj 20 MG/ML (Fe Equiv): INTRAVENOUS | Qty: 10 | Status: AC

## 2022-12-10 ENCOUNTER — Inpatient Hospital Stay: Payer: Medicaid Other

## 2022-12-10 ENCOUNTER — Encounter: Payer: Self-pay | Admitting: Oncology

## 2022-12-10 ENCOUNTER — Inpatient Hospital Stay: Payer: Medicaid Other | Admitting: Oncology

## 2023-10-26 ENCOUNTER — Ambulatory Visit (INDEPENDENT_AMBULATORY_CARE_PROVIDER_SITE_OTHER): Payer: Self-pay

## 2023-10-26 ENCOUNTER — Encounter: Payer: Self-pay | Admitting: Emergency Medicine

## 2023-10-26 ENCOUNTER — Encounter: Payer: Self-pay | Admitting: Oncology

## 2023-10-26 ENCOUNTER — Ambulatory Visit
Admission: RE | Admit: 2023-10-26 | Discharge: 2023-10-26 | Disposition: A | Discharge status: Home / Self Care | Payer: Self-pay | Source: Ambulatory Visit | Attending: Emergency Medicine | Admitting: Emergency Medicine

## 2023-10-26 VITALS — BP 116/79 | HR 91 | Temp 98.6°F | Resp 16 | Ht 63.0 in | Wt 310.0 lb

## 2023-10-26 DIAGNOSIS — M62830 Muscle spasm of back: Secondary | ICD-10-CM

## 2023-10-26 DIAGNOSIS — R519 Headache, unspecified: Secondary | ICD-10-CM

## 2023-10-26 DIAGNOSIS — S39012A Strain of muscle, fascia and tendon of lower back, initial encounter: Secondary | ICD-10-CM

## 2023-10-26 DIAGNOSIS — S161XXA Strain of muscle, fascia and tendon at neck level, initial encounter: Secondary | ICD-10-CM

## 2023-10-26 MED ORDER — METHYLPREDNISOLONE 4 MG PO TBPK: ORAL_TABLET | Freq: Every day | ORAL | 0 refills | Status: AC

## 2023-10-26 MED ORDER — NAPROXEN 500 MG PO TABS: 500.0000 mg | ORAL_TABLET | Freq: Two times a day (BID) | ORAL | 0 refills | Status: AC

## 2023-10-26 MED ORDER — ACETAMINOPHEN 325 MG PO TABS
975.0000 mg | ORAL_TABLET | Freq: Once | ORAL | Status: AC
  Administered 2023-10-26: 975 mg via ORAL

## 2023-10-26 MED ORDER — METHOCARBAMOL 500 MG PO TABS: 500.0000 mg | ORAL_TABLET | Freq: Three times a day (TID) | ORAL | 0 refills | Status: AC | PRN

## 2023-10-26 MED ORDER — IBUPROFEN 800 MG PO TABS
800.0000 mg | ORAL_TABLET | Freq: Once | ORAL | Status: AC
  Administered 2023-10-26: 800 mg via ORAL
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
## 2023-10-26 NOTE — Discharge Instructions (Addendum)
 I did not appreciate any fracture or problems on your x-ray, but we will contact you if the radiology overread is different enough from mine and we need to change management.  People tend to feel worse over the next several days, but most people are back to normal in 1 week. A small number of people will have persistent pain for up to six weeks.  Take the Naprosyn and 1000 milligrams of Tylenol  together twice a day.  This is an extremely effective combination for pain.  Skelaxin for muscle relaxant, Medrol Dosepak to help with inflammation.   Some people may require physical therapy. Early range of motion neck exercises has been shown to speed recovery. Start doing them as soon as possible. Start doing small range and amplitude movements of your neck, first in one direction, then the other. Repeat this 10 times in each direction every hour while awake. Do these to the maximum comfortable range. You may do this sitting up or lying down.  Heat, gentle range of motion, deep tissue massage and stretching can be helpful.  Here is a list of primary care providers who are taking new patients:  [facility] [place] Dr. Rebekah Canada (sports medicine) Dr. Saralee Cummins Cody Das, PA Dr. Henriette Lofty [address] [place] [address] [phone]  [facility] at [facility] [address] [place], [place] [address] [phone]  [facility] [place] [address] [place] [address]  [phone]  [facility] [address] [place], [place] [address] [phone]  [facility] [address]  [phone] [place], [place] [address]    Go to www.goodrx.com  or www.costplusdrugs.com to look up your medications. This will give you a list of where you can find your prescriptions at the most affordable prices. Or ask the pharmacist what the cash price is, or if they have any other discount programs available to help make your medication more affordable.  This can be less expensive than what you would pay with insurance.
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
